# Patient Record
Sex: Male | Born: 1980 | Race: White | Hispanic: No | Marital: Single | Smoking: Current every day smoker
Health system: Southern US, Community
[De-identification: ages and names within clinical notes are randomized; demographics above are authoritative.]

## PROBLEM LIST (undated history)

## (undated) HISTORY — PX: KIDNEY STONE SURGERY: SHX686

---

## 2004-05-08 ENCOUNTER — Other Ambulatory Visit: Payer: Self-pay

## 2004-12-10 ENCOUNTER — Emergency Department: Payer: Self-pay | Admitting: Emergency Medicine

## 2005-05-09 ENCOUNTER — Emergency Department: Payer: Self-pay | Admitting: Emergency Medicine

## 2009-02-02 ENCOUNTER — Emergency Department: Payer: Self-pay | Admitting: Internal Medicine

## 2009-11-25 ENCOUNTER — Emergency Department: Payer: Self-pay | Admitting: Emergency Medicine

## 2010-06-21 ENCOUNTER — Emergency Department: Payer: Self-pay | Admitting: Emergency Medicine

## 2011-07-20 ENCOUNTER — Emergency Department: Payer: Self-pay | Admitting: Emergency Medicine

## 2011-08-19 ENCOUNTER — Emergency Department: Payer: Self-pay

## 2011-09-30 ENCOUNTER — Emergency Department: Payer: Self-pay | Admitting: Emergency Medicine

## 2011-12-30 ENCOUNTER — Emergency Department: Payer: Self-pay | Admitting: Emergency Medicine

## 2011-12-30 LAB — CBC WITH DIFFERENTIAL/PLATELET
Eosinophil #: 0.1 10*3/uL (ref 0.0–0.7)
HCT: 45.5 % (ref 40.0–52.0)
HGB: 15.5 g/dL (ref 13.0–18.0)
Lymphocyte #: 2.2 10*3/uL (ref 1.0–3.6)
Lymphocyte %: 24.1 %
MCH: 31.4 pg (ref 26.0–34.0)
MCHC: 34.1 g/dL (ref 32.0–36.0)
Monocyte #: 0.7 10*3/uL (ref 0.0–0.7)
Monocyte %: 7.5 %
Neutrophil #: 6 10*3/uL (ref 1.4–6.5)
Platelet: 232 10*3/uL (ref 150–440)
RBC: 4.93 10*6/uL (ref 4.40–5.90)
RDW: 12.8 % (ref 11.5–14.5)
WBC: 9 10*3/uL (ref 3.8–10.6)

## 2011-12-30 LAB — COMPREHENSIVE METABOLIC PANEL
Alkaline Phosphatase: 86 U/L (ref 50–136)
BUN: 13 mg/dL (ref 7–18)
Bilirubin,Total: 0.5 mg/dL (ref 0.2–1.0)
Calcium, Total: 10 mg/dL (ref 8.5–10.1)
Chloride: 106 mmol/L (ref 98–107)
Creatinine: 0.96 mg/dL (ref 0.60–1.30)
Potassium: 3.9 mmol/L (ref 3.5–5.1)
SGPT (ALT): 17 U/L
Sodium: 142 mmol/L (ref 136–145)
Total Protein: 8.1 g/dL (ref 6.4–8.2)

## 2011-12-30 LAB — URINALYSIS, COMPLETE
Bilirubin,UR: NEGATIVE
Glucose,UR: NEGATIVE mg/dL (ref 0–75)
Nitrite: NEGATIVE
Protein: 100
RBC,UR: 312 /HPF (ref 0–5)
Squamous Epithelial: NONE SEEN
WBC UR: 9 /HPF (ref 0–5)

## 2012-01-25 ENCOUNTER — Emergency Department: Payer: Self-pay | Admitting: Emergency Medicine

## 2012-01-25 LAB — URINALYSIS, COMPLETE
Bacteria: NONE SEEN
Bilirubin,UR: NEGATIVE
Glucose,UR: NEGATIVE mg/dL (ref 0–75)
Leukocyte Esterase: NEGATIVE
Nitrite: NEGATIVE
Protein: NEGATIVE
RBC,UR: 1 /HPF (ref 0–5)
Squamous Epithelial: NONE SEEN

## 2012-01-25 LAB — CBC
HCT: 42.9 % (ref 40.0–52.0)
HGB: 14.4 g/dL (ref 13.0–18.0)
MCH: 31.1 pg (ref 26.0–34.0)
MCHC: 33.5 g/dL (ref 32.0–36.0)
WBC: 8.8 10*3/uL (ref 3.8–10.6)

## 2012-01-25 LAB — COMPREHENSIVE METABOLIC PANEL
BUN: 11 mg/dL (ref 7–18)
Bilirubin,Total: 0.4 mg/dL (ref 0.2–1.0)
Chloride: 103 mmol/L (ref 98–107)
Co2: 26 mmol/L (ref 21–32)
Creatinine: 0.94 mg/dL (ref 0.60–1.30)
EGFR (African American): >60
EGFR (Non-African Amer.): >60
Osmolality: 281 (ref 275–301)
Sodium: 141 mmol/L (ref 136–145)
Total Protein: 7.8 g/dL (ref 6.4–8.2)

## 2012-01-27 ENCOUNTER — Ambulatory Visit: Payer: Self-pay | Admitting: Medical

## 2012-06-15 ENCOUNTER — Emergency Department: Payer: Self-pay | Admitting: Internal Medicine

## 2012-06-15 LAB — URINALYSIS, COMPLETE
Bilirubin,UR: NEGATIVE
Blood: NEGATIVE
Leukocyte Esterase: NEGATIVE
Nitrite: NEGATIVE
Ph: 9 (ref 4.5–8.0)
Protein: NEGATIVE
RBC,UR: NONE SEEN /HPF (ref 0–5)
Squamous Epithelial: <1

## 2012-06-15 LAB — BASIC METABOLIC PANEL
Calcium, Total: 9.7 mg/dL (ref 8.5–10.1)
Chloride: 105 mmol/L (ref 98–107)
Co2: 26 mmol/L (ref 21–32)
Creatinine: 0.87 mg/dL (ref 0.60–1.30)
Potassium: 3.9 mmol/L (ref 3.5–5.1)
Sodium: 140 mmol/L (ref 136–145)

## 2012-06-15 LAB — CBC
HCT: 47 % (ref 40.0–52.0)
HGB: 15.1 g/dL (ref 13.0–18.0)
MCHC: 32.2 g/dL (ref 32.0–36.0)
Platelet: 221 10*3/uL (ref 150–440)
RDW: 13.5 % (ref 11.5–14.5)
WBC: 7.2 10*3/uL (ref 3.8–10.6)

## 2012-06-20 ENCOUNTER — Emergency Department: Payer: Self-pay | Admitting: Unknown Physician Specialty

## 2012-06-20 LAB — BASIC METABOLIC PANEL
BUN: 13 mg/dL (ref 7–18)
Chloride: 109 mmol/L — ABNORMAL HIGH (ref 98–107)
Creatinine: 0.88 mg/dL (ref 0.60–1.30)
EGFR (Non-African Amer.): >60
Glucose: 94 mg/dL (ref 65–99)
Osmolality: 283 (ref 275–301)
Potassium: 4.1 mmol/L (ref 3.5–5.1)
Sodium: 142 mmol/L (ref 136–145)

## 2012-06-20 LAB — URINALYSIS, COMPLETE
Bacteria: NONE SEEN
Ketone: NEGATIVE
Ph: 9 (ref 4.5–8.0)
Protein: NEGATIVE
Specific Gravity: 1.005 (ref 1.003–1.030)
Squamous Epithelial: NONE SEEN

## 2012-06-20 LAB — CBC
HCT: 46.5 % (ref 40.0–52.0)
HGB: 15.2 g/dL (ref 13.0–18.0)
MCH: 30.6 pg (ref 26.0–34.0)
MCHC: 32.7 g/dL (ref 32.0–36.0)
MCV: 94 fL (ref 80–100)
RDW: 13.1 % (ref 11.5–14.5)

## 2012-06-20 LAB — DRUG SCREEN, URINE
Benzodiazepine, Ur Scrn: NEGATIVE (ref ?–200)
Cannabinoid 50 Ng, Ur ~~LOC~~: POSITIVE (ref ?–50)
Cocaine Metabolite,Ur ~~LOC~~: NEGATIVE (ref ?–300)
Methadone, Ur Screen: NEGATIVE (ref ?–300)
Phencyclidine (PCP) Ur S: NEGATIVE (ref ?–25)

## 2012-09-03 ENCOUNTER — Emergency Department: Payer: Self-pay | Admitting: Emergency Medicine

## 2013-01-10 IMAGING — CR DG ABDOMEN 1V
1 series · 2 of 2 positions shown · non-contrast
Comparison: none

REASON FOR EXAM: sx of renal colic
COMMENTS:

[Series 1: t abdomen supine · 0.14mm/px · 2 of 2 slices shown]
[im 1/2]
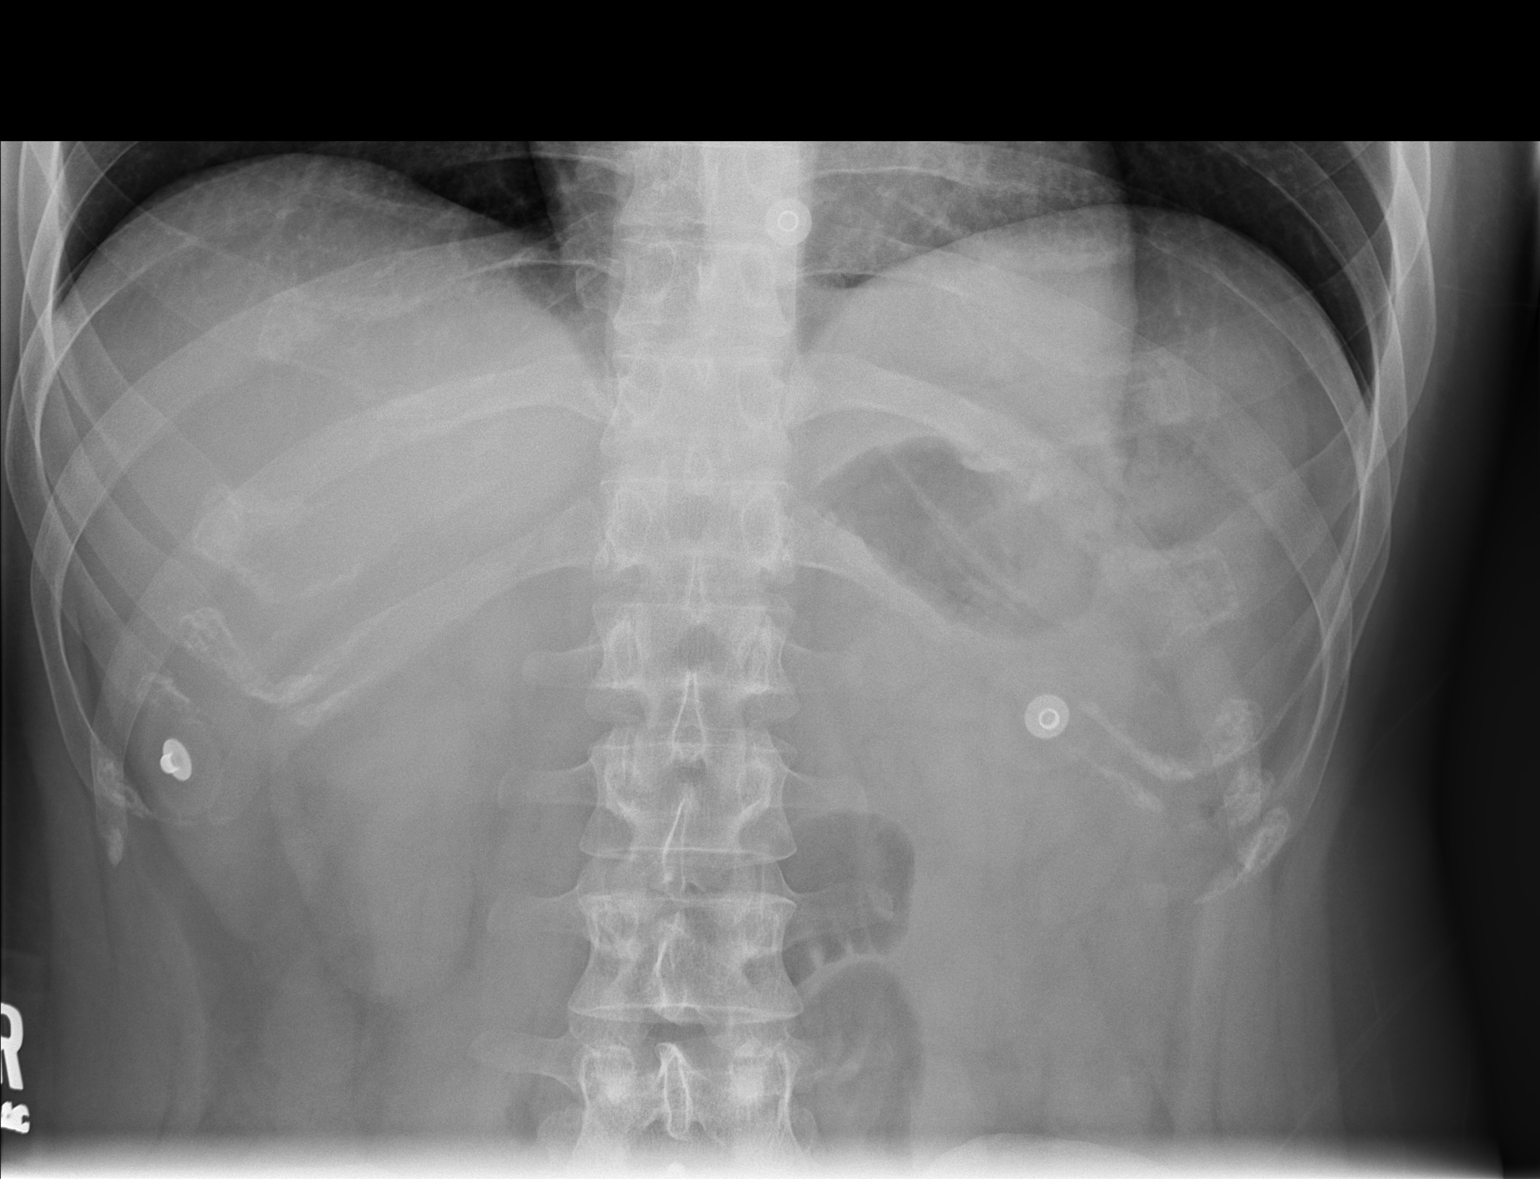
[im 2/2]
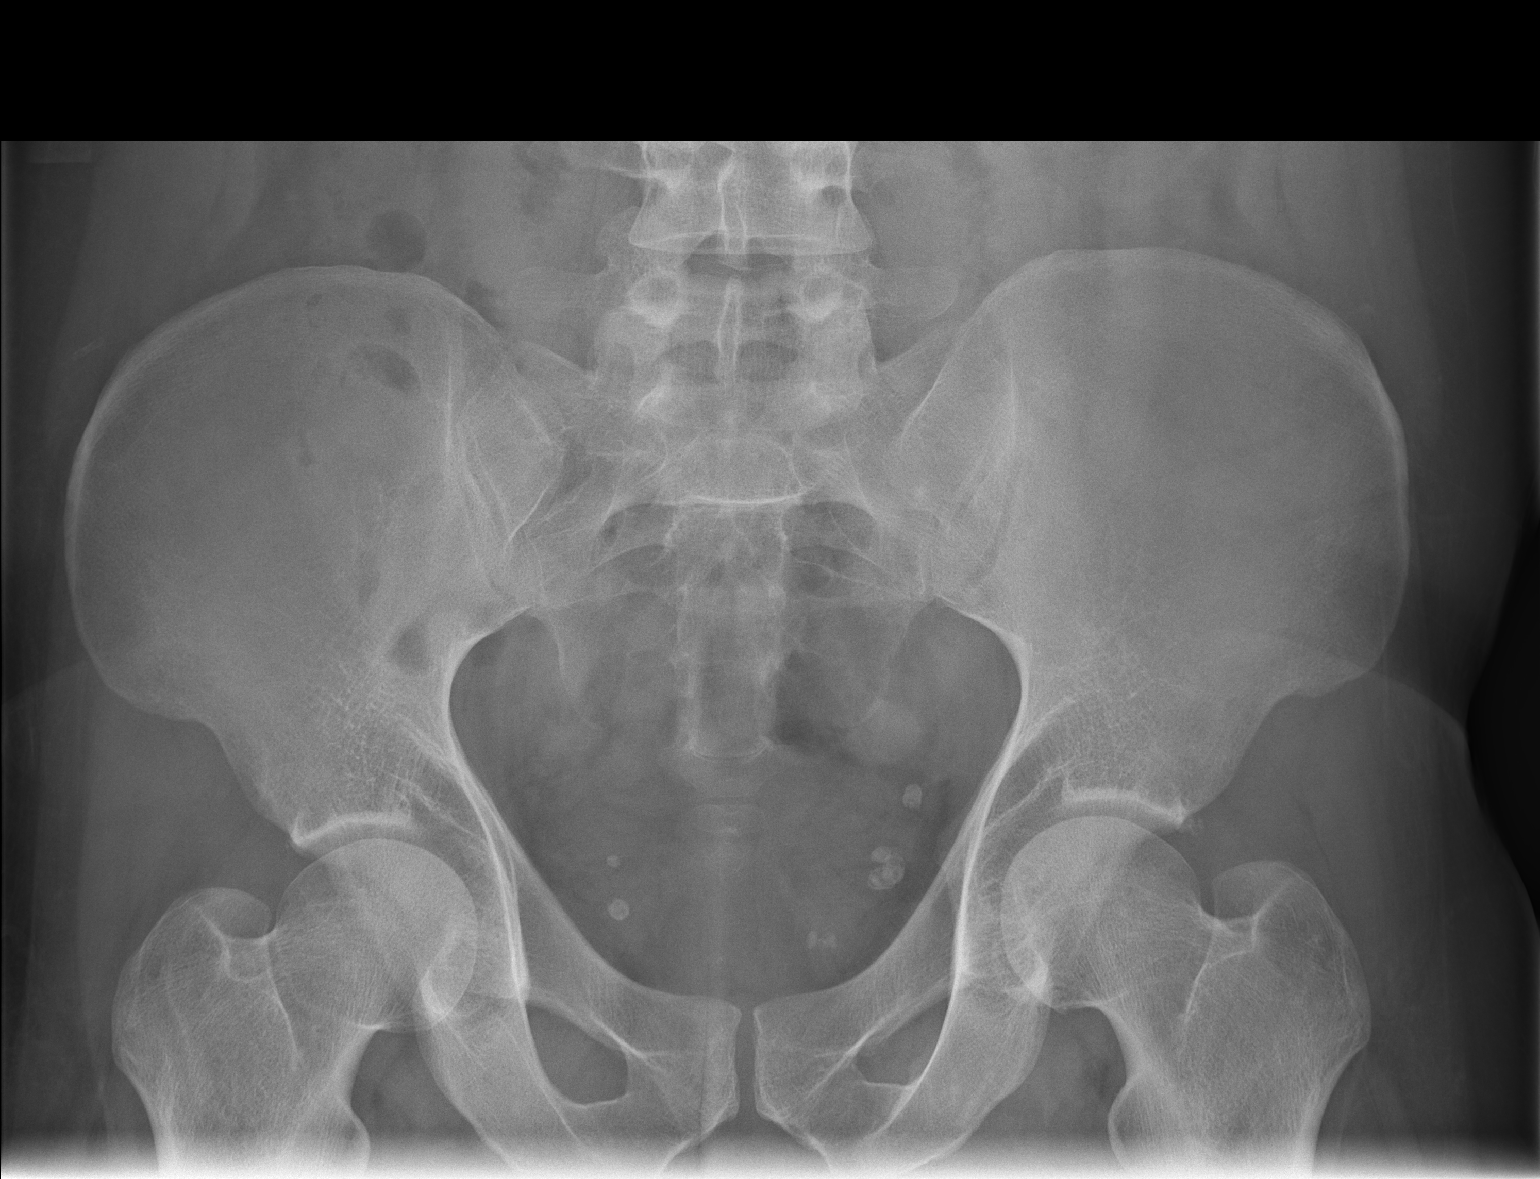

[2 of 2 positions shown; findings below may reference images not displayed]

PROCEDURE:     DXR - DXR KIDNEY URETER BLADDER  - December 30, 2011  [DATE]

RESULT:     Soft tissue structures are unremarkable. Multiple calcifications
in the pelvis consistent phleboliths. This ureteral stone cannot be
completely excluded. Tiny ureteral stone projecting over the left sacrum may
be present. No nephrolithiasis noted. Gas pattern nonspecific.
IMPRESSION: Cannot exclude tiny calcified stone projecting over the
left sacrum. Calcifications in the pelvis most likely phleboliths.

## 2013-02-01 ENCOUNTER — Emergency Department: Payer: Self-pay

## 2013-02-01 LAB — CBC
HCT: 42.1 % (ref 40.0–52.0)
MCH: 30.6 pg (ref 26.0–34.0)
MCHC: 33.7 g/dL (ref 32.0–36.0)
MCV: 91 fL (ref 80–100)
Platelet: 240 10*3/uL (ref 150–440)
RBC: 4.63 10*6/uL (ref 4.40–5.90)
WBC: 7.9 10*3/uL (ref 3.8–10.6)

## 2013-02-01 LAB — BASIC METABOLIC PANEL
BUN: 7 mg/dL (ref 7–18)
Calcium, Total: 9.6 mg/dL (ref 8.5–10.1)
Chloride: 108 mmol/L — ABNORMAL HIGH (ref 98–107)
Co2: 27 mmol/L (ref 21–32)
EGFR (African American): >60
EGFR (Non-African Amer.): >60
Glucose: 92 mg/dL (ref 65–99)
Osmolality: 279 (ref 275–301)
Potassium: 3.4 mmol/L — ABNORMAL LOW (ref 3.5–5.1)
Sodium: 141 mmol/L (ref 136–145)

## 2013-02-01 LAB — URINALYSIS, COMPLETE
Ketone: NEGATIVE
Leukocyte Esterase: NEGATIVE
Nitrite: NEGATIVE
Ph: 9 (ref 4.5–8.0)
Specific Gravity: 1.012 (ref 1.003–1.030)
Squamous Epithelial: <1
WBC UR: <1 /HPF (ref 0–5)

## 2013-02-07 IMAGING — CR DG ABDOMEN 1V
1 series · 2 of 2 positions shown · non-contrast
Comparison: none

REASON FOR EXAM: Kidney Stone
COMMENTS:

[Series 1: t abdomen supine · 0.14mm/px · 2 of 2 slices shown]
[im 1/2]
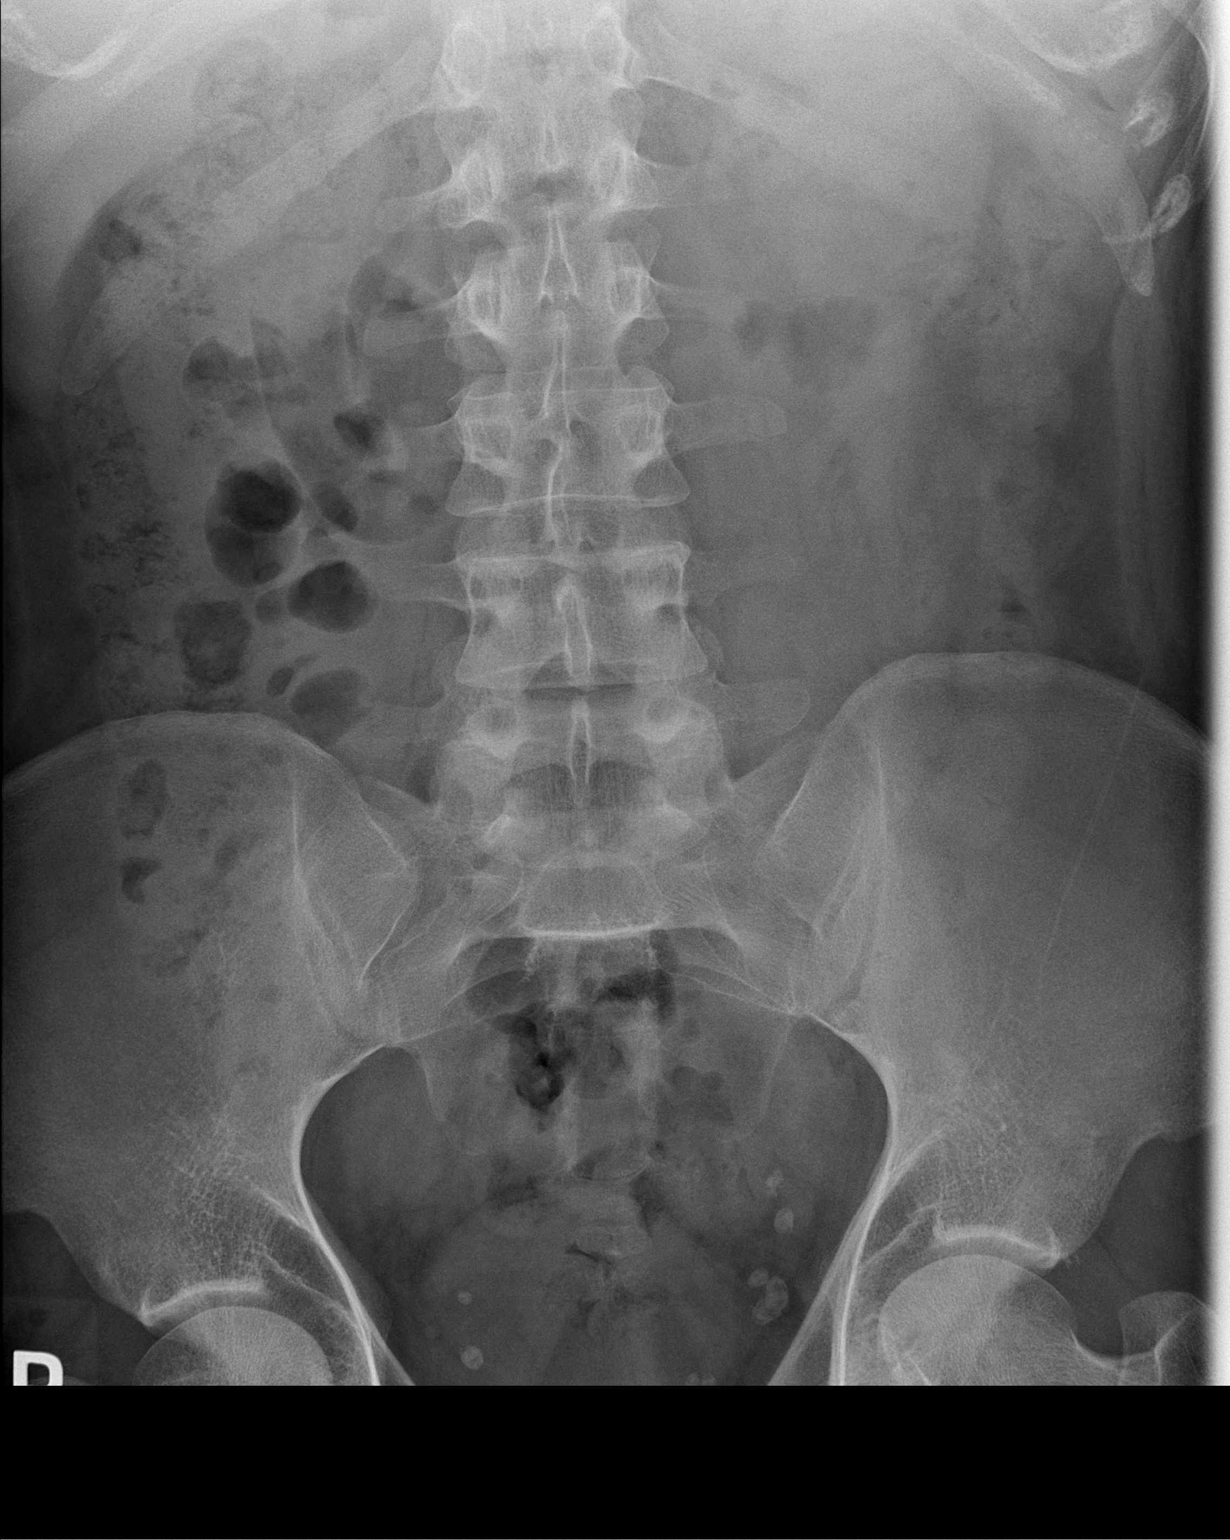
[im 2/2]
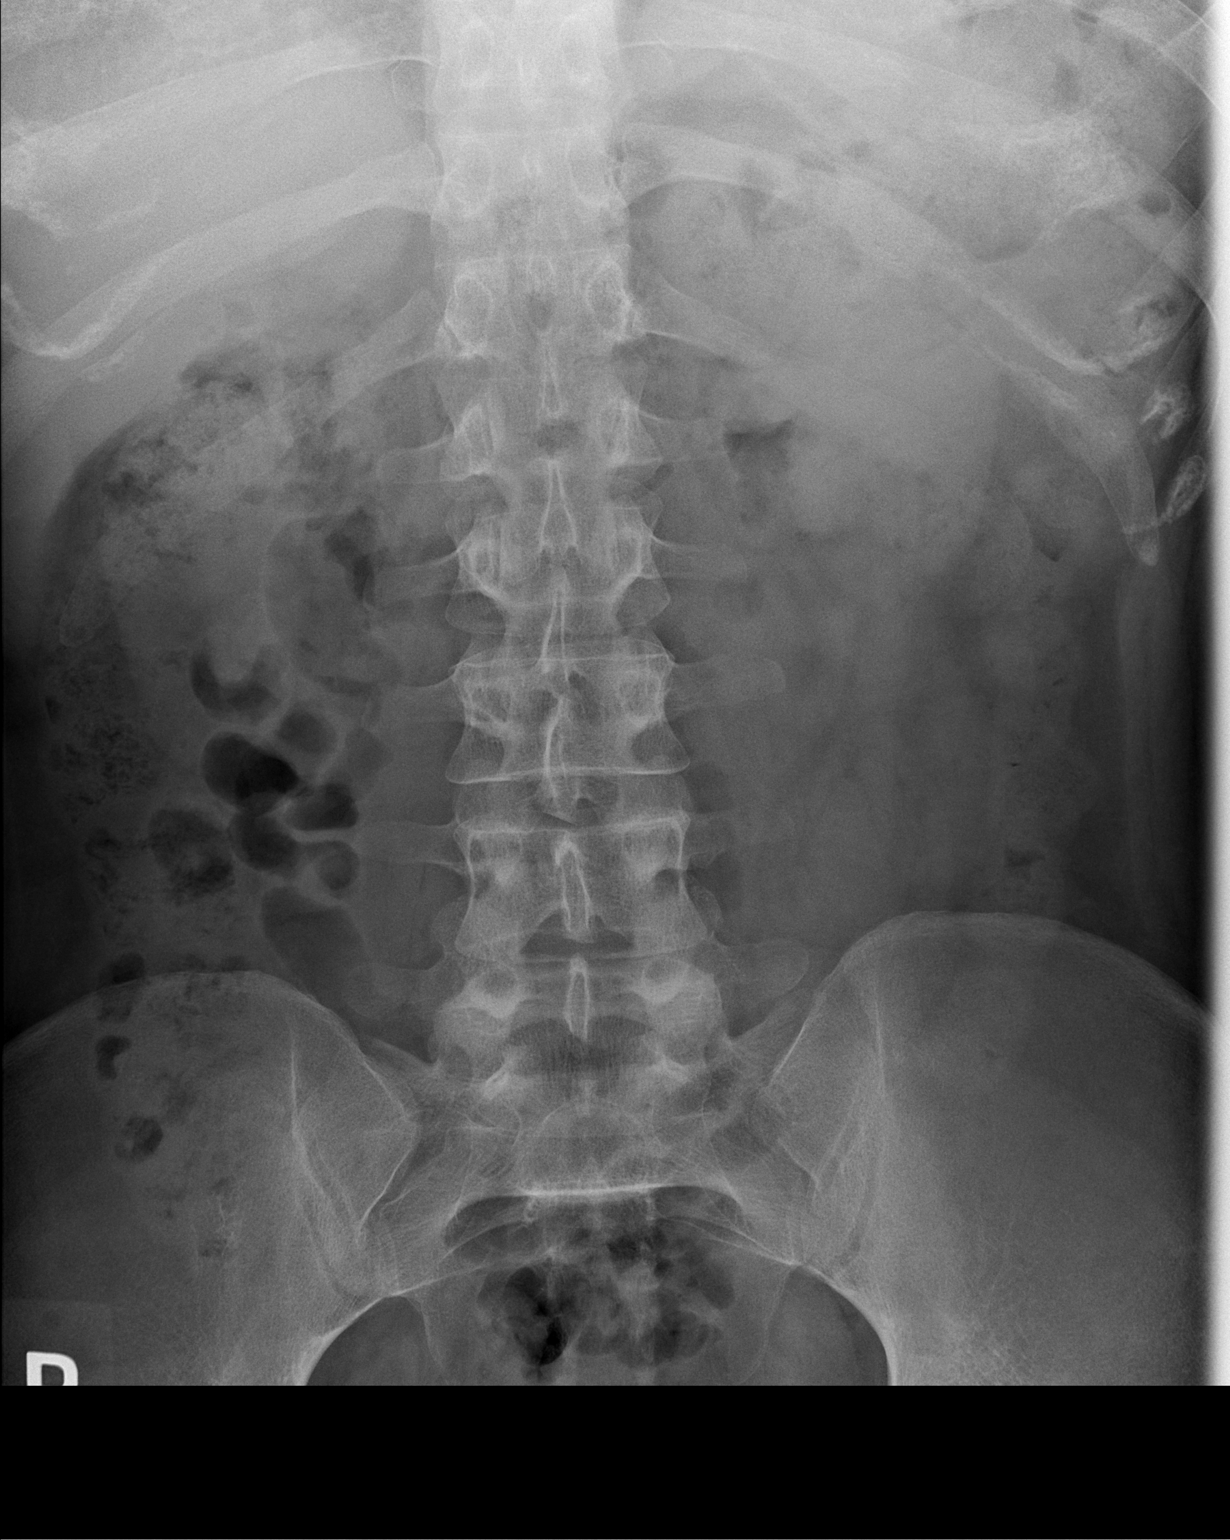

[2 of 2 positions shown; findings below may reference images not displayed]

PROCEDURE:     DXR - DXR KIDNEY URETER BLADDER  - January 27, 2012  [DATE]

RESULT:     An AP view of the abdomen was obtained. There is a moderately
large amount of fecal material in the colon. No definite intrarenal
calcifications are seen. There is an 8 mm calcification in the left pelvis
compatible with a distal left ureteral stone. Multiple additional
phleboliths are also seen in the pelvic area bilaterally. No significant
osseous abnormalities are identified.
IMPRESSION: Distal left ureterolithiasis.

## 2013-02-13 ENCOUNTER — Emergency Department: Payer: Self-pay | Admitting: Emergency Medicine

## 2013-02-13 LAB — COMPREHENSIVE METABOLIC PANEL
BUN: 13 mg/dL (ref 7–18)
Bilirubin,Total: 0.5 mg/dL (ref 0.2–1.0)
Calcium, Total: 9.4 mg/dL (ref 8.5–10.1)
Chloride: 105 mmol/L (ref 98–107)
Co2: 29 mmol/L (ref 21–32)
EGFR (African American): >60
Glucose: 95 mg/dL (ref 65–99)
Osmolality: 277 (ref 275–301)
Potassium: 3.8 mmol/L (ref 3.5–5.1)
SGOT(AST): 21 U/L (ref 15–37)
SGPT (ALT): 27 U/L (ref 12–78)
Total Protein: 8.4 g/dL — ABNORMAL HIGH (ref 6.4–8.2)

## 2013-02-13 LAB — CBC
HCT: 44.4 % (ref 40.0–52.0)
HGB: 15.4 g/dL (ref 13.0–18.0)
MCH: 31.3 pg (ref 26.0–34.0)
RBC: 4.9 10*6/uL (ref 4.40–5.90)
RDW: 13.5 % (ref 11.5–14.5)

## 2013-02-13 LAB — URINALYSIS, COMPLETE
Bacteria: NONE SEEN
Bilirubin,UR: NEGATIVE
Glucose,UR: NEGATIVE mg/dL (ref 0–75)
Nitrite: NEGATIVE
Protein: 30
Specific Gravity: 1.023 (ref 1.003–1.030)
WBC UR: NONE SEEN /HPF (ref 0–5)

## 2013-04-21 ENCOUNTER — Observation Stay: Payer: Self-pay | Admitting: Internal Medicine

## 2013-04-21 LAB — CBC
HCT: 42.9 % (ref 40.0–52.0)
MCH: 30.4 pg (ref 26.0–34.0)
MCHC: 33.5 g/dL (ref 32.0–36.0)
MCV: 91 fL (ref 80–100)
Platelet: 234 10*3/uL (ref 150–440)
RBC: 4.72 10*6/uL (ref 4.40–5.90)
WBC: 8.1 10*3/uL (ref 3.8–10.6)

## 2013-04-21 LAB — URINALYSIS, COMPLETE
Leukocyte Esterase: NEGATIVE
Ph: 6 (ref 4.5–8.0)
Specific Gravity: 1.011 (ref 1.003–1.030)
WBC UR: 1 /HPF (ref 0–5)

## 2013-04-21 LAB — DRUG SCREEN, URINE
Amphetamines, Ur Screen: NEGATIVE (ref ?–1000)
Barbiturates, Ur Screen: NEGATIVE (ref ?–200)
Cocaine Metabolite,Ur ~~LOC~~: NEGATIVE (ref ?–300)
MDMA (Ecstasy)Ur Screen: NEGATIVE (ref ?–500)
Tricyclic, Ur Screen: NEGATIVE (ref ?–1000)

## 2013-04-21 LAB — COMPREHENSIVE METABOLIC PANEL
Albumin: 3.9 g/dL (ref 3.4–5.0)
BUN: 10 mg/dL (ref 7–18)
Calcium, Total: 9.4 mg/dL (ref 8.5–10.1)
Creatinine: 0.84 mg/dL (ref 0.60–1.30)
Glucose: 127 mg/dL — ABNORMAL HIGH (ref 65–99)
SGPT (ALT): 234 U/L — ABNORMAL HIGH (ref 12–78)

## 2013-04-21 LAB — ETHANOL: Ethanol: <3 mg/dL

## 2013-04-21 LAB — SALICYLATE LEVEL: Salicylates, Serum: 3.1 mg/dL — ABNORMAL HIGH

## 2013-04-21 LAB — TSH: Thyroid Stimulating Horm: 2.58 u[IU]/mL

## 2013-04-21 LAB — PROTIME-INR: INR: 1

## 2013-04-21 LAB — APTT: Activated PTT: 31 secs (ref 23.6–35.9)

## 2013-04-22 LAB — COMPREHENSIVE METABOLIC PANEL
Albumin: 3.5 g/dL (ref 3.4–5.0)
Alkaline Phosphatase: 138 U/L — ABNORMAL HIGH (ref 50–136)
Calcium, Total: 9 mg/dL (ref 8.5–10.1)
Co2: 28 mmol/L (ref 21–32)
Creatinine: 0.69 mg/dL (ref 0.60–1.30)
EGFR (African American): >60
EGFR (Non-African Amer.): >60
Glucose: 87 mg/dL (ref 65–99)
SGOT(AST): 90 U/L — ABNORMAL HIGH (ref 15–37)
SGPT (ALT): 187 U/L — ABNORMAL HIGH (ref 12–78)
Total Protein: 7.2 g/dL (ref 6.4–8.2)

## 2013-04-26 ENCOUNTER — Emergency Department: Payer: Self-pay | Admitting: Internal Medicine

## 2013-06-19 ENCOUNTER — Emergency Department: Payer: Self-pay | Admitting: Emergency Medicine

## 2013-06-19 LAB — URINALYSIS, COMPLETE
Bilirubin,UR: NEGATIVE
Ketone: NEGATIVE
Leukocyte Esterase: NEGATIVE
Nitrite: NEGATIVE
Ph: 8 (ref 4.5–8.0)
Protein: NEGATIVE
Squamous Epithelial: <1

## 2013-06-19 LAB — COMPREHENSIVE METABOLIC PANEL
Albumin: 4.2 g/dL (ref 3.4–5.0)
Alkaline Phosphatase: 142 U/L — ABNORMAL HIGH (ref 50–136)
BUN: 10 mg/dL (ref 7–18)
Bilirubin,Total: 0.7 mg/dL (ref 0.2–1.0)
Calcium, Total: 9.5 mg/dL (ref 8.5–10.1)
Co2: 27 mmol/L (ref 21–32)
Creatinine: 0.84 mg/dL (ref 0.60–1.30)
EGFR (African American): >60
EGFR (Non-African Amer.): >60
Glucose: 140 mg/dL — ABNORMAL HIGH (ref 65–99)
Osmolality: 275 (ref 275–301)
Potassium: 4 mmol/L (ref 3.5–5.1)
Total Protein: 7.9 g/dL (ref 6.4–8.2)

## 2013-06-19 LAB — DRUG SCREEN, URINE
Amphetamines, Ur Screen: NEGATIVE (ref ?–1000)
Barbiturates, Ur Screen: NEGATIVE (ref ?–200)
Benzodiazepine, Ur Scrn: NEGATIVE (ref ?–200)
Cannabinoid 50 Ng, Ur ~~LOC~~: POSITIVE (ref ?–50)
Opiate, Ur Screen: NEGATIVE (ref ?–300)
Phencyclidine (PCP) Ur S: NEGATIVE (ref ?–25)
Tricyclic, Ur Screen: NEGATIVE (ref ?–1000)

## 2013-06-19 LAB — CBC
HCT: 44.2 % (ref 40.0–52.0)
HGB: 15.1 g/dL (ref 13.0–18.0)
MCH: 30.4 pg (ref 26.0–34.0)
MCV: 89 fL (ref 80–100)
Platelet: 191 10*3/uL (ref 150–440)
RBC: 4.96 10*6/uL (ref 4.40–5.90)

## 2013-06-19 LAB — ETHANOL: Ethanol %: <0.003 % (ref 0.000–0.080)

## 2013-07-23 ENCOUNTER — Emergency Department: Payer: Self-pay | Admitting: Emergency Medicine

## 2013-09-16 IMAGING — CR DG HAND 2V*L*
1 series · 2 of 2 positions shown · non-contrast
Comparison: none

REASON FOR EXAM: post reduction
COMMENTS:

PROCEDURE:     DXR - DXR HAND LT TWO VIEWS  - September 04, 2012  [DATE]
RESULT:     Comparison:  None

[Series 1: pa · 0.17mm/px · 2 of 2 slices shown]
[im 1/2]
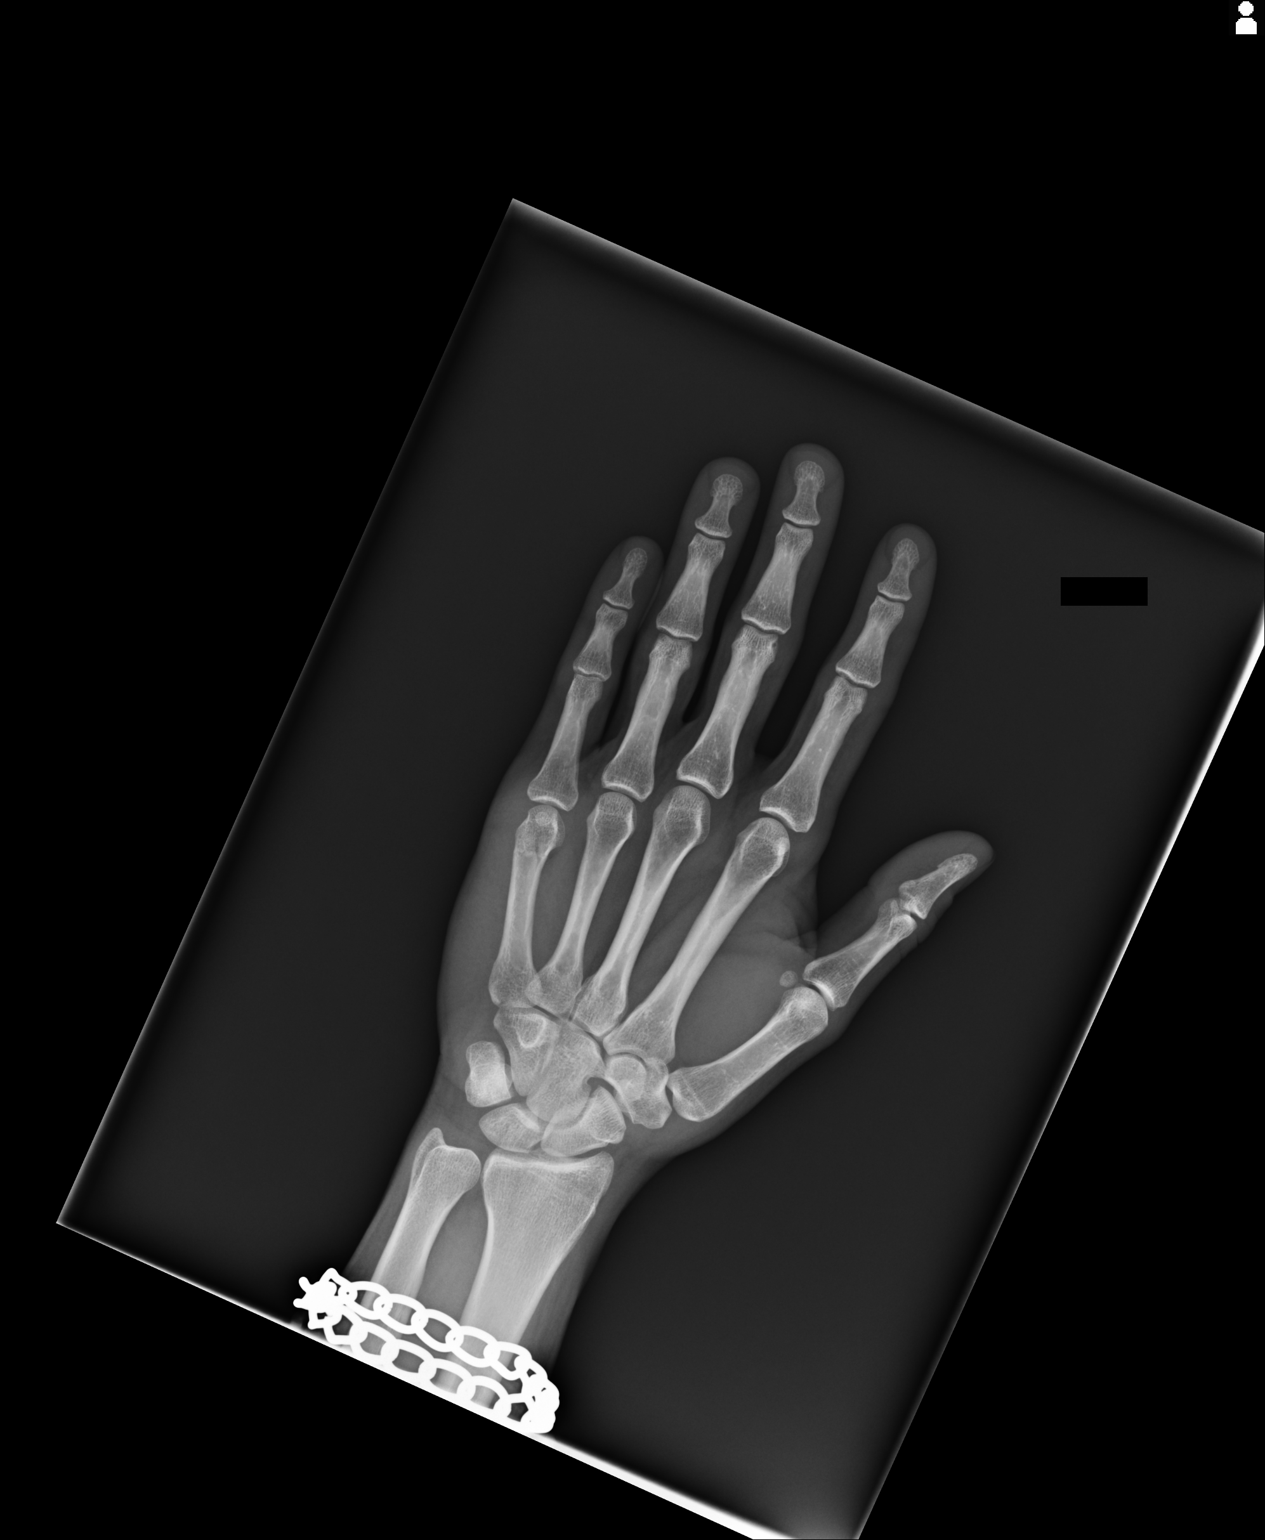
[im 2/2]
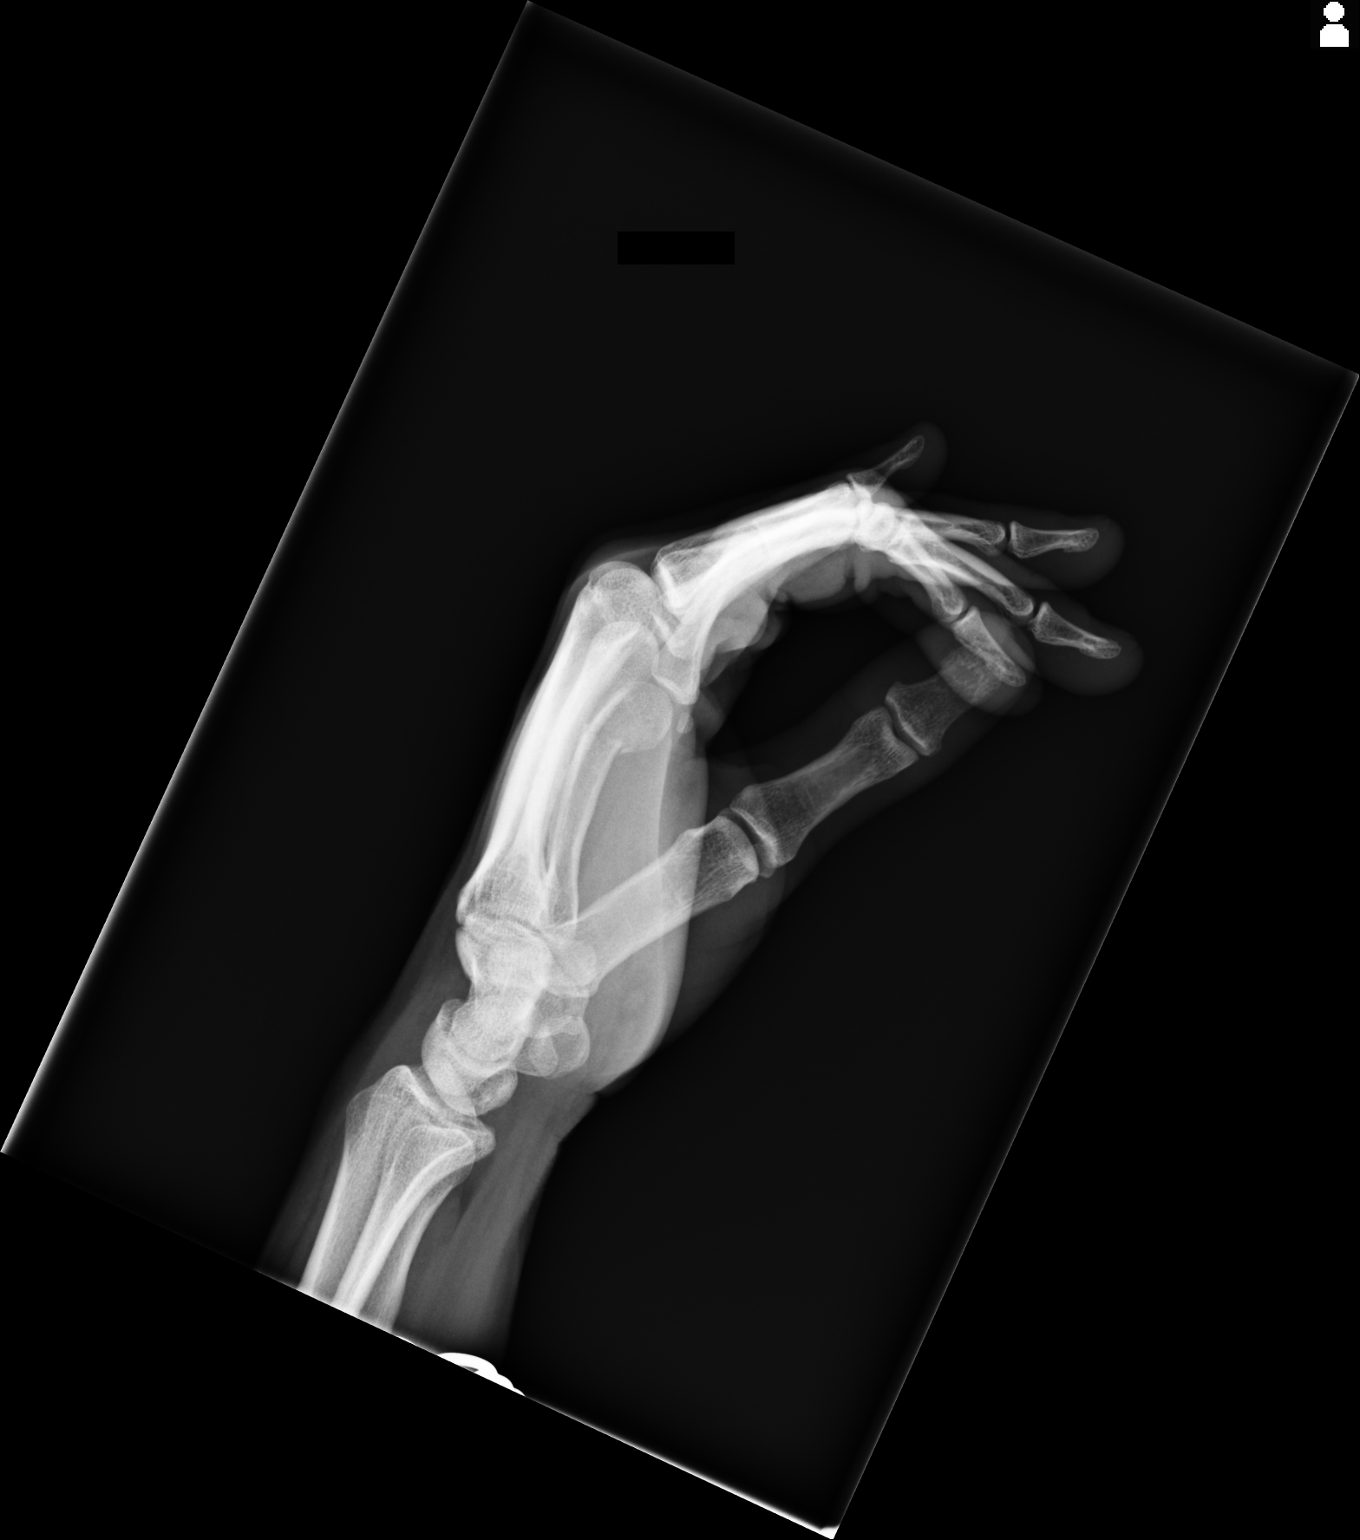

[2 of 2 positions shown; findings below may reference images not displayed]

FINDINGS: AP and lateral views of the left hand demonstrates a left fifth metacarpal
neck fracture with improved alignment.
IMPRESSION: Please see above.

[REDACTED]

## 2013-11-29 ENCOUNTER — Emergency Department: Payer: Self-pay | Admitting: Emergency Medicine

## 2013-11-29 LAB — URINALYSIS, COMPLETE
Bacteria: NONE SEEN
Bilirubin,UR: NEGATIVE
Ketone: NEGATIVE
Leukocyte Esterase: NEGATIVE
RBC,UR: 2065 /HPF (ref 0–5)
Squamous Epithelial: 2
WBC UR: 73 /HPF (ref 0–5)

## 2013-11-29 LAB — COMPREHENSIVE METABOLIC PANEL
Anion Gap: 4 — ABNORMAL LOW (ref 7–16)
BUN: 8 mg/dL (ref 7–18)
Chloride: 101 mmol/L (ref 98–107)
EGFR (African American): >60
EGFR (Non-African Amer.): >60
Potassium: 3.6 mmol/L (ref 3.5–5.1)
SGOT(AST): 57 U/L — ABNORMAL HIGH (ref 15–37)
Sodium: 134 mmol/L — ABNORMAL LOW (ref 136–145)

## 2013-11-29 LAB — CBC WITH DIFFERENTIAL/PLATELET
Basophil %: 0.6 %
Eosinophil #: 0.1 10*3/uL (ref 0.0–0.7)
Eosinophil %: 1.7 %
HCT: 44 % (ref 40.0–52.0)
Lymphocyte #: 1.8 10*3/uL (ref 1.0–3.6)
Lymphocyte %: 26 %
MCH: 30.5 pg (ref 26.0–34.0)
MCHC: 33.5 g/dL (ref 32.0–36.0)
MCV: 91 fL (ref 80–100)
Monocyte #: 0.7 x10 3/mm (ref 0.2–1.0)
Neutrophil #: 4.3 10*3/uL (ref 1.4–6.5)
Neutrophil %: 62.1 %
Platelet: 226 10*3/uL (ref 150–440)
RDW: 13.7 % (ref 11.5–14.5)
WBC: 6.9 10*3/uL (ref 3.8–10.6)

## 2014-02-28 ENCOUNTER — Emergency Department: Payer: Self-pay | Admitting: Internal Medicine

## 2014-08-28 ENCOUNTER — Emergency Department: Payer: Self-pay | Admitting: Emergency Medicine

## 2014-08-28 LAB — CBC WITH DIFFERENTIAL/PLATELET
BASOS ABS: 0.1 10*3/uL (ref 0.0–0.1)
Basophil %: 0.4 %
Eosinophil #: 0.2 10*3/uL (ref 0.0–0.7)
Eosinophil %: 1.4 %
HCT: 43.4 % (ref 40.0–52.0)
HGB: 13.7 g/dL (ref 13.0–18.0)
LYMPHS ABS: 1.9 10*3/uL (ref 1.0–3.6)
LYMPHS PCT: 10.5 %
MCH: 29 pg (ref 26.0–34.0)
MCHC: 31.7 g/dL — ABNORMAL LOW (ref 32.0–36.0)
MCV: 92 fL (ref 80–100)
Monocyte #: 1.2 x10 3/mm — ABNORMAL HIGH (ref 0.2–1.0)
Monocyte %: 6.9 %
NEUTROS ABS: 14.3 10*3/uL — AB (ref 1.4–6.5)
Neutrophil %: 80.8 %
PLATELETS: 250 10*3/uL (ref 150–440)
RBC: 4.73 10*6/uL (ref 4.40–5.90)
RDW: 13.9 % (ref 11.5–14.5)
WBC: 17.7 10*3/uL — ABNORMAL HIGH (ref 3.8–10.6)

## 2014-08-28 LAB — URINALYSIS, COMPLETE
BACTERIA: NONE SEEN
BILIRUBIN, UR: NEGATIVE
Glucose,UR: NEGATIVE mg/dL (ref 0–75)
KETONE: NEGATIVE
LEUKOCYTE ESTERASE: NEGATIVE
NITRITE: NEGATIVE
Ph: 8 (ref 4.5–8.0)
Protein: 30
RBC,UR: 4264 /HPF (ref 0–5)
SPECIFIC GRAVITY: 1.016 (ref 1.003–1.030)
SQUAMOUS EPITHELIAL: NONE SEEN
WBC UR: NONE SEEN /HPF (ref 0–5)

## 2014-08-28 LAB — BASIC METABOLIC PANEL
Anion Gap: 8 (ref 7–16)
BUN: 11 mg/dL (ref 7–18)
CHLORIDE: 105 mmol/L (ref 98–107)
CO2: 26 mmol/L (ref 21–32)
CREATININE: 0.92 mg/dL (ref 0.60–1.30)
Calcium, Total: 8.9 mg/dL (ref 8.5–10.1)
EGFR (African American): >60
EGFR (Non-African Amer.): >60
GLUCOSE: 100 mg/dL — AB (ref 65–99)
OSMOLALITY: 277 (ref 275–301)
POTASSIUM: 3.6 mmol/L (ref 3.5–5.1)
Sodium: 139 mmol/L (ref 136–145)

## 2014-08-29 LAB — DRUG SCREEN, URINE
AMPHETAMINES, UR SCREEN: NEGATIVE (ref ?–1000)
BARBITURATES, UR SCREEN: NEGATIVE (ref ?–200)
Benzodiazepine, Ur Scrn: NEGATIVE (ref ?–200)
CANNABINOID 50 NG, UR ~~LOC~~: POSITIVE (ref ?–50)
COCAINE METABOLITE, UR ~~LOC~~: NEGATIVE (ref ?–300)
MDMA (Ecstasy)Ur Screen: NEGATIVE (ref ?–500)
METHADONE, UR SCREEN: NEGATIVE (ref ?–300)
Opiate, Ur Screen: POSITIVE (ref ?–300)
Phencyclidine (PCP) Ur S: NEGATIVE (ref ?–25)
Tricyclic, Ur Screen: NEGATIVE (ref ?–1000)

## 2015-03-22 NOTE — Discharge Summary (Signed)
PATIENT NAME:  Ricardo Tran, Ricardo Tran MR#:  [id number] DATE OF BIRTH:  June 10, 1981  DATE OF ADMISSION:  04/21/2013 DATE OF DISCHARGE:  04/22/2013  DISCHARGE DIAGNOSES:  1.  Heroin abuse, willing for rehab.  2.  Elevated liver function tests coming down to safe range.  3.  Smoker.   CONDITION ON DISCHARGE:  Stable.  CODE STATUS:  FULL CODE.   MEDICATIONS ON DISCHARGE: 1.  Suboxone sublingual film, one application 2 times a day.  2.  Loperamide 2 mg oral capsule 4 times a day as needed for diarrhea.  3.  Methocarbamol 750 mg oral tablet 2 times a day as needed for pain.   DIET ON DISCHARGE:  Regular.  CONSISTENCY:  Regular.   ACTIVITY:  As tolerated.   TIMEFRAME TO FOLLOWUP:  Within 1 to 2 weeks. He is advised to follow with primary care physician in 1 to 2 weeks to get the pending results from hospital for hepatitis.   HOSPITAL COURSE AND STAY:  The patient is a 34 year old male who is addicted to IV heroin almost every day, and now willing to go for rehab. He went to contact the facility and they told him to get medical clearance before he can be accepted as inpatient rehab for opioid addiction. He came to the ER for medical clearance and he is found having his LFTs were high. He said that he was taking on and off Percocet, sometimes 10 to 15 per day to get himself out of pain due to heroin withdrawal. His acetaminophen level was less than 2 but his AST and ALT were in the range of 200, so he was admitted to medical service for observation and medical clearance before he can be accepted at rehab facility. His hepatitis and HIV levels were sent in. He was started [facility]steine [facility], was remaining in mild withdrawal syndrome due to pain and nausea and some diarrhea secondary to opioid withdrawal but managed by psychiatrist team with Suboxone, loperamide and Zofran and Phenergan as needed basis. The next day, his liver functions came down further and  his AST and ALT were in the range of 100, so poison control suggested to stop an acetylcysteine as that is in safe range, and the patient was asymptomatic, medically stable to go for opioid rehab, so he is being discharged for that.   Smoker. He was active smoker and at the time he was counseled for smoking cessation for 5 minutes and he agreed to have nicotine inhaler while he is in the hospital.   IMPORTANT LABORATORY RESULTS IN THE HOSPITAL:  Total WBC count 8000, hemoglobin 14.4, platelet count 234. BUN 10, creatinine 0.84, alkaline phosphate 158, SGPT 234 and SGOT 126, with bilirubin of 0.4 on presentation. Ethanol level was less than 3. TSH was 2.58. Urinalysis was negative. Urine toxicology was positive for opiate and cannabinoid. Salicylate level was 3.1. Acetaminophen level was less than 2. INR was 1, and so the next day on followup of his liver function, alkaline phosphate came to 138, SGPT to 187, SGOT to 90, and he is being discharged with this level, medically stable.   TOTAL TIME SPENT IN THIS DISCHARGE:  40 minutes.  ____________________________ Hope PigeonVaibhavkumar G. Elisabeth PigeonVachhani, MD vgv:jm D: 04/22/2013 11:10:00 ET T: 04/22/2013 11:44:04 ET JOB#: [id number]  cc: Hope PigeonVaibhavkumar G. Elisabeth PigeonVachhani, MD, <Dictator> Altamese DillingVAIBHAVKUMAR Marbeth Smedley MD ELECTRONICALLY SIGNED 04/27/2013 14:24

## 2015-03-22 NOTE — Consult Note (Signed)
PATIENT NAME:  Ricardo Tran, Ricardo Tran MR#:  [id number] DATE OF BIRTH:  28-Sep-1981  DATE OF CONSULTATION:  04/21/2013  CONSULTING PHYSICIAN:  Audery Amel, MD  IDENTIFYING INFORMATION AND REASON FOR CONSULT: A 34 year old man presented to the Emergency Room requesting stabilization for detox from heroin. Consultation for assistance with opiate detox.   HISTORY OF PRESENT ILLNESS: Information obtained from the patient and the chart. The patient came to our Emergency Room after first going to [facility] today. [facility] told him that he needed to be medically stabilized before he can go there for treatment of his opiate dependence. The patient reports that he has been using intravenous heroin on a daily basis for probably about 12 years. His current level of use is about a gram a day in 2 divided doses. He has been he going at this rate consistently for years. He says he smokes pot occasionally, but does not use cocaine or other drugs. He does not drink alcohol. On occasion he will substitute oral narcotics that he gets off the street for his heroine when he had difficulty getting heroin on a particular day. This past Monday, he took about 17, 10 mg of Percocet tablets. His last heroin dose was just about 24 hours ago. The patient he does not report being depressed. Denies suicidal ideation. Denies psychotic symptoms. He says that he is fed up with how he is living, particularly since he has two children at home and that he would like to get serious about finally getting off of drugs.   PAST PSYCHIATRIC HISTORY: Has not been treated for any other psychiatric condition other than his substance abuse and the substance abuse itself, he is only done self-detox on a couple of occasions. He has never been in a hospital for detox and he has never been in any kind of rehab or other substance abuse treatment program. No history of suicide attempts. No history of violence. No history of psychotic symptoms.   PAST MEDICAL HISTORY: The  patient does not know of any ongoing medical problems. On presentation to the Emergency Room here, he is liver enzymes were elevated. Given the history of having taken  possibly a very large dose of acetaminophen just a few days ago, there is concern that one possible cause of this could be acetaminophen toxicity. The patient says he has never had hepatitis testing or ever had HIV test.   SOCIAL HISTORY: The patient is married, has two children, one age 34 years, the other one a toddler. The patient works at the [employer] in [place]. His wife works at [employer]. He has family in the area and says that his parents would probably be supportive. He does not have insurance, but thinks that his family would probably help to support financially his substance abuse treatment.   CURRENT MEDICATIONS: No prescribed medicines. He is taking,  by his estimate, a gram of heroin a day in divided doses. Occasional marijuana.   ALLERGIES: No known drug allergies.   REVIEW OF SYSTEMS: At the time of my interview, the patient complains of a full-body aches and pains, especially back pain and neck pain. He has congestion, chills, general aches, a feeling like he is having the flu. He has had some nausea but has not vomited. He has been having some diarrhea. He does not report any suicidal or homicidal ideation. Does not report any psychotic symptoms.   MENTAL STATUS EXAM: The patient was interviewed in his hospital room. This gentleman looks  significantly older than his stated age. He was cooperative with the interview. Eye contact was intermittent. Psychomotor activity was sluggish,  as of somebody who is feeling sick. Speech was decreased in tone and amount, but easy to converse. Affect dysphoric, blunted, again as of somebody who might be feeling sick. Mood stated as being okay. Thoughts are lucid with no evidence of delusional thinking or loosening of associations. Denies auditory or visual hallucinations.  Denies any suicidal or homicidal ideation. Alert and oriented x 4. Good judgment and insight. Normal intelligence.   LABORATORY RESULTS: His CBC is normal. Chemistry panel showed a glucose of 127 on a nonfasting draw. Alkaline phosphatase elevated 158, ALT elevated 234, AST elevated 126. Alcohol undetected. TSH normal. Urinalysis unremarkable. Drug screen positive for opiates and cannabis. Acetaminophen level done this morning was undetectable. Salicylates slightly elevated at 3.1. PTT 31, prothrombin INR 1.0.   ASSESSMENT: A 34 year old man with heroin dependence came in for detox. Complicating factor are the elevated liver enzymes. The fact that he may have consumed a large amount of acetaminophen raises the concern of the acetaminophen toxicity. Because he is giving the history of having done this several days ago, the fact that his Tylenol level undetectable, may not exclude the Tylenol toxicity. Also in the differential diagnosis would be hepatitis A, B or C.  The patient required hospitalization apparently on the medical service for stabilization. The Emergency Room physician talked to poison control, who recommended that he be put on the medical service and stabilized. The patient has been ordered to get acetylcysteine  treatment for Tylenol toxicity, apparently as a presumptive given his history. For the opiate withdrawal, he was initially ordered, morphine IV. This is probably not a very sufficient way to do it. I have recommended that we switched to Suboxone. The patient can be given up to three days of opiate replacement detox in the hospital. If he is ready to leave the hospital prior to that, he will need some assistance in referral to rehab. I am going to put in that care management consult for that.   TREATMENT PLAN: Care management consult will be put into help with finding rehab treatment either at [facility] or at other appropriate facility. At this point, I doubt that he will need more benefit from  transfer to our psychiatric unit, because we are not substance abuse specialized. The patient is requesting referral to a Suboxone provider as well. I have ordered Suboxone 4 mg twice a day for a day and then 4 mg a day for another 2 days after that. He also cam have Imodium as needed,  Zofran as needed, Robaxin as needed. No indication for other psychiatric treatment at this point. It psychiatric assistance is needed over the weekend, please call the on-call physician.   DIAGNOSIS, PRINCIPAL AND PRIMARY:  AXIS I: Opiate dependence.   SECONDARY DIAGNOSES: AXIS I:  Cannabis abuse.   AXIS II: Deferred.   AXIS III: Elevated liver enzymes of unclear etiology.   AXIS IV: Moderate chronic stress from his substance abuse.   AXIS V: Functioning at time of evaluation 38.   ____________________________ Audery AmelJohn T. Dawnetta Copenhaver, MD jtc:cc D: 04/21/2013 19:12:02 ET T: 04/21/2013 20:40:07 ET JOB#: [id number]  cc: Audery AmelJohn T. Lindy Garczynski, MD, <Dictator> Audery AmelJOHN T Taitum Alms MD ELECTRONICALLY SIGNED 04/24/2013 21:09

## 2015-03-22 NOTE — Consult Note (Signed)
Brief Consult Note: Diagnosis: opiate dependence.   Patient was seen by consultant.   Consult note dictated.   Recommend further assessment or treatment.   Orders entered.   Comments: Psychiatry: Patient seen and orders done. PAtient having opiate withdrawl and stabilization of liver enzymes.Ordered suboxone for detox to replace iv morphine. Added some other prn meds. Added HIV test to already done Hepatitis tests. Will put in order for care management to follow up on rehab/[facility] after he is medically stabilized. See Full note.  Electronic Signatures: Audery Amellapacs, John T (MD)  (Signed 23-May-14 18:56)  Authored: Brief Consult Note   Last Updated: 23-May-14 18:56 by Audery Amellapacs, John T (MD)

## 2015-03-22 NOTE — H&P (Signed)
PATIENT NAME:  Ricardo Tran, Ricardo Tran MR#:  [id number] DATE OF BIRTH:  [birth year]  DATE OF ADMISSION:  04/21/2013  REFERRING EMERGENCY ROOM PHYSICIAN: Dr. Dorothea Glassman, MD.   CHIEF COMPLAINT: Elevated liver function tests.   HISTORY OF PRESENT ILLNESS: The patient is a 34 year old male who is a drug addict,  taking heroin IV every day for the last 12 years, now decided to clean himself and go for detox. He wanted to go to rehab for that, but they wanted him to a medical clearance before they can accept him for inpatient rehab and so he was referred to ER. He came to ER, had his initial blood work and it showed elevated LFTs, so rehab for detox. One, came to be medically cleared before he can go for it. Dr. Darnelle Catalan who is ER physician spoke to [facility] as the patient says that he was trying to detox himself and sometimes he takes like 10 to 15 tablets of Percocet in order to relieve his pain, trying not to take heroin. The last time he took like that was four days ago on Monday; but he has been on unsuccessful helping him to get rid of heroin, so when Dr.  Darnelle Catalan spoke to [facility], his LFTs are only in the range of 200.  He does not need to have antidote therapy for acetaminophen toxicity and his acetaminophen level is also normal. He needs to be under observation and follow one more level on medical floor and then decide about medical clearance to go for detox. So hospitalist service is called in for this issue.   REVIEW OF SYSTEMS: Negative for fever, fatigue, weakness.  He states he started feeling pain all over and his last heroin he took was last night. No weight loss.   EYES: No blurring or double vision, pain or redness.   EARS, NOSE, THROAT: No tinnitus, ear pain, hearing loss or discharge.   RESPIRATORY: No cough, wheezing, hemoptysis or dyspnea.   CARDIOVASCULAR: No chest pain, orthopnea, edema or arrhythmia.   GASTROINTESTINAL: Feeling nauseous, but no vomiting,  pain  GENITOURINARY: No dysuria, hematuria or increased frequency of the urination.   ENDOCRINE: no nocturia or heat or cold intolerance.   MUSCULOSKELETAL: No pain or swelling in the joints. He has pain in the back.   NEUROLOGICAL: No numbness, weakness or tremors.   PAST MEDICAL HISTORY: None.   PAST SURGICAL HISTORY: Kidney stone and has undergone lithotripsy for that.   HOME MEDICATIONS: He takes sometimes Percocet or Vicodin to get of pain and he tries not to take heroin.   SOCIAL HISTORY: He injects heroin daily sometimes twice daily for the last 12 years and he also smoked, marijuana. He smoked 2 packs cigarettes per day. Denies alcohol use.   FAMILY HISTORY: Positive for liver disease as he says his father told him like that; he does not know what disease of the liver.   PHYSICAL EXAMINATION: VITAL SIGNS: In ER, temperature 98.4, pulse 53, respirations 18, blood pressure 118/66 and pulse oximetry 98 on room air.   GENERAL: He is fully alert, oriented to time, place, and person. No acute distress.   HEENT: Head and neck atraumatic. Conjunctivae pink. Oral mucosa moist.   NECK: Supple. No JVD.   RESPIRATORY: Bilateral clear and equal air entry.   CARDIOVASCULAR: S1, S2 present, regular. No murmurs appreciated.   ABDOMEN: Soft, nontender. Bowel sounds present. No organomegaly.   SKIN: No rashes. Joints: No edema.   NEUROLOGICAL:  Power 5/, moves all 4 limbs. No tremors.   PSYCHIATRIC: Does not appear in any acute psychiatric illness.   LABORATORY RESULTS: Glucose 127, BUN 10, creatinine 0.84, sodium 137, potassium 3.8, chloride 104, CO2 29. Ethanol level less than 0.03. Total protein 7.8, albumin 3.9, bilirubin 0.4. Next, alkaline phosphatase 158, SGOT 126, SGPT 234. TSH level 2.58. Urine for toxicology positive for cocaine and opiate. INR is 1. Urinalysis is negative. Acetaminophen level less than 2  and salicylate level is 8.1.   ASSESSMENT AND PLAN: A 34 year old  male who is a heroin addict and being admitted to medical service for medical clearance due to elevated liver function tests before going for heroin detox rehab program.  1. Elevated liver function tests. The patient is taking 10 to 15 Percocet on impulsive basis every few days when he tries not to take heroin. Last time he took 4 days ago. Currently, acetaminophen level is less than 2 and his AST, ALT are in the range of 200.  [facility]inda [facility] and they suggested to observation him and follow his liver function test one more time after a few hours and see how he does. So we will admit him to medical floor for observation and follow complete metabolic panel tomorrow morning, follow liver function tests and see.  2. Heroin addict started having withdrawals.  He said that he started feeling pain all over and he is also feeling nauseous. Will give symptomatic treatment with morphine IV and Zofran for now and once his liver function tests do not rise further and start dropping, he will be transferred to inpatient rehab for heroin. 3. Smoker. Tobacco abuse. He smoked 2 packs cigarettes per day. Smoking cessation counseling is done and he agrees to try nicotine inhaler while he is in the hospital. Total counseling done for 5 minutes.   CODE STATUS: Full code.   TOTAL TIME SPENT ON THIS ADMISSION: 45 minutes    ____________________________ Hope PigeonVaibhavkumar G. Elisabeth PigeonVachhani, MD vgv:rw D: 04/21/2013 12:36:00 ET T: 04/21/2013 13:06:22 ET JOB#: [id number]  cc: Hope PigeonVaibhavkumar G. Elisabeth PigeonVachhani, MD, <Dictator> Altamese DillingVAIBHAVKUMAR Raivyn Kabler MD ELECTRONICALLY SIGNED 04/27/2013 14:23

## 2015-09-09 IMAGING — US US RENAL KIDNEY
1 series · 14 of 23 positions shown · non-contrast
Comparison: CT abdomen pelvis dated 01/25/2012

CLINICAL DATA: Right flank pain, hematuria, history of stones

EXAM:
RENAL/URINARY TRACT ULTRASOUND COMPLETE

[Series 1: us renal kidney · 0.21mm/px · 14 of 23 slices shown]
[im 1/23]
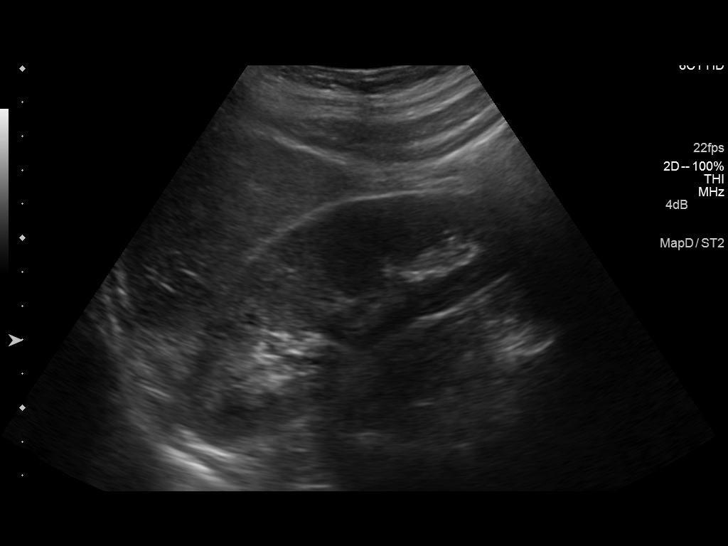
[im 3/23]
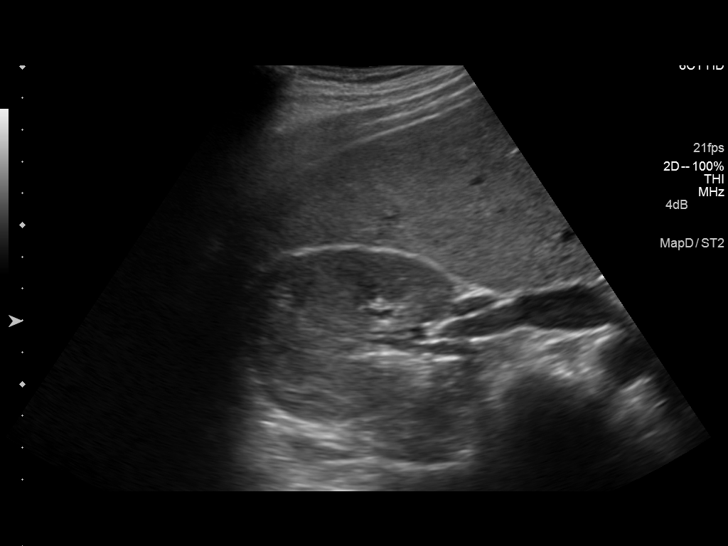
[im 5/23]
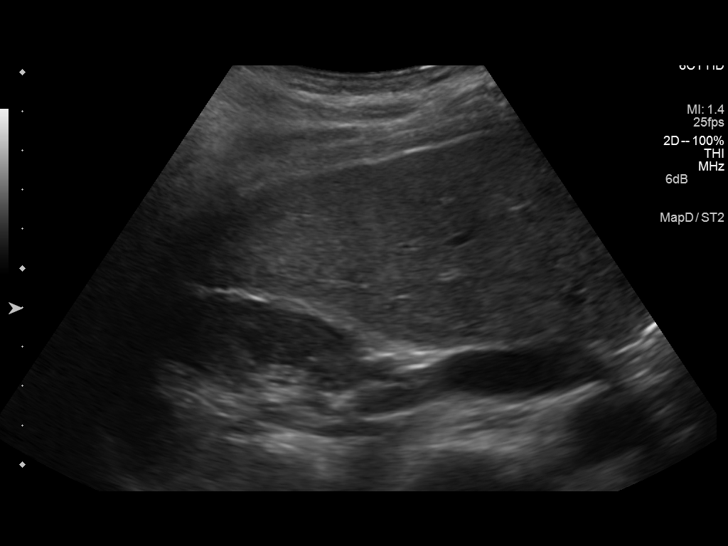
[im 6/23]
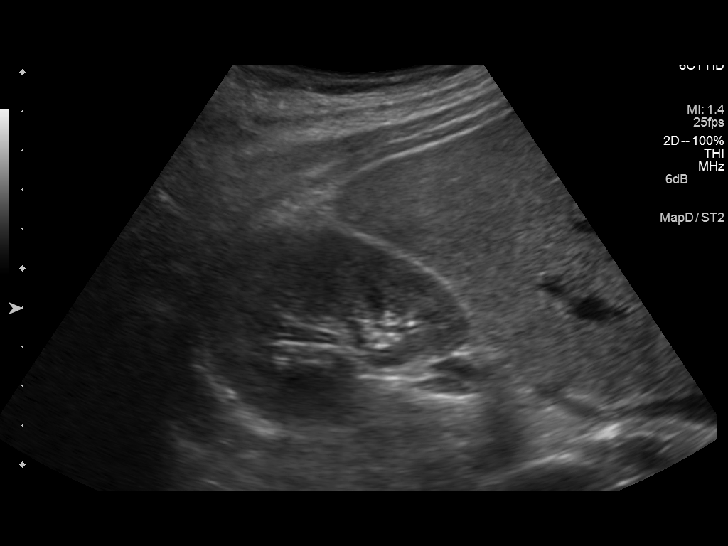
[im 8/23]
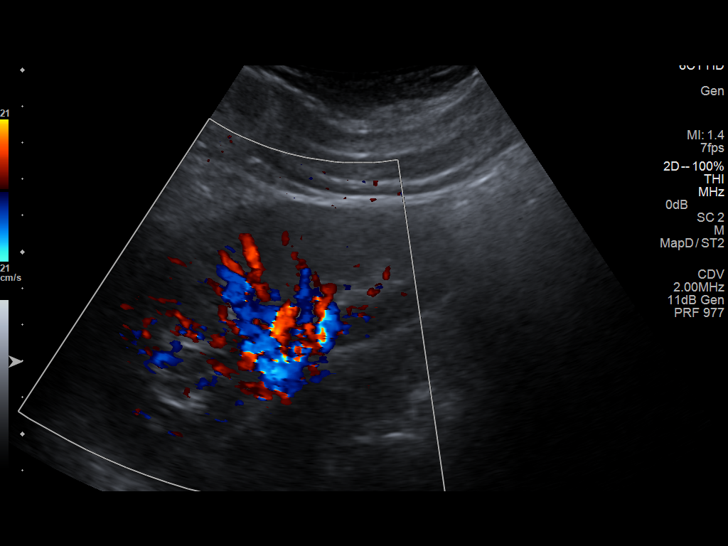
[im 10/23]
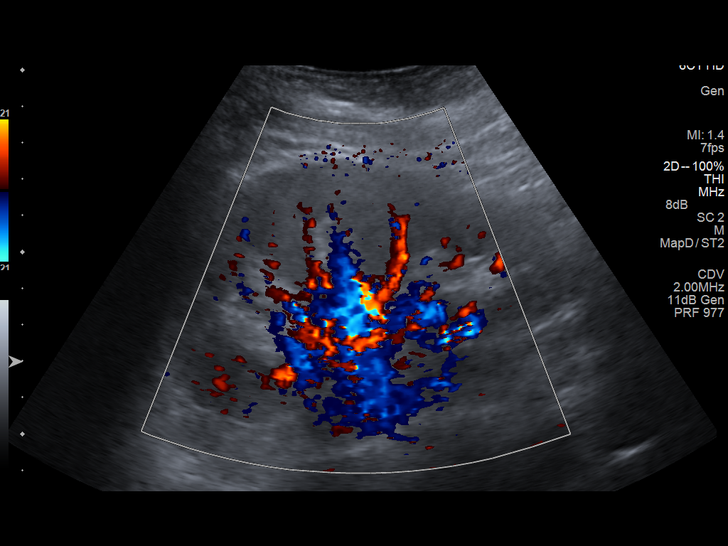
[im 11/23]
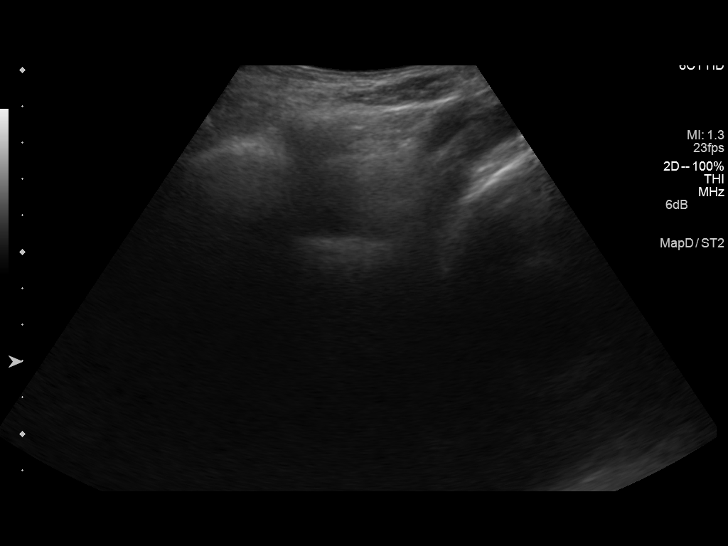
[im 13/23]
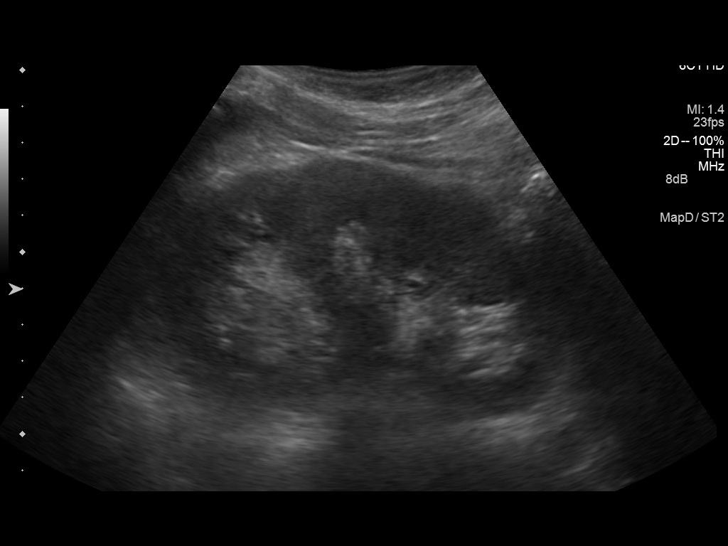
[im 14/23]
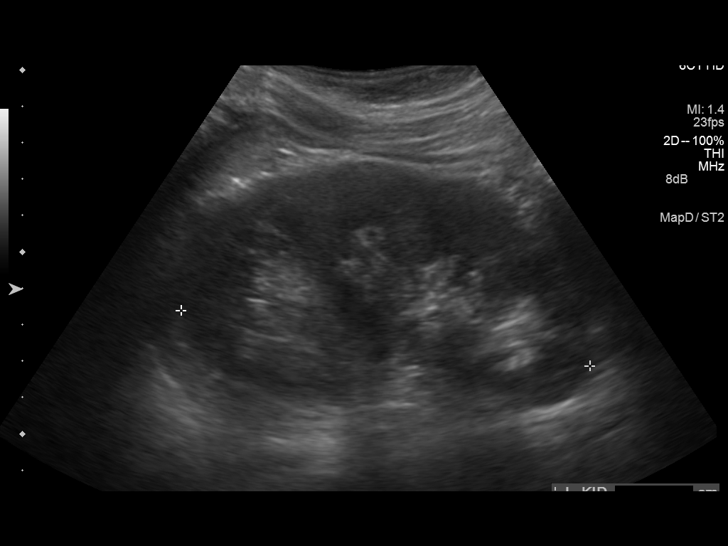
[im 16/23]
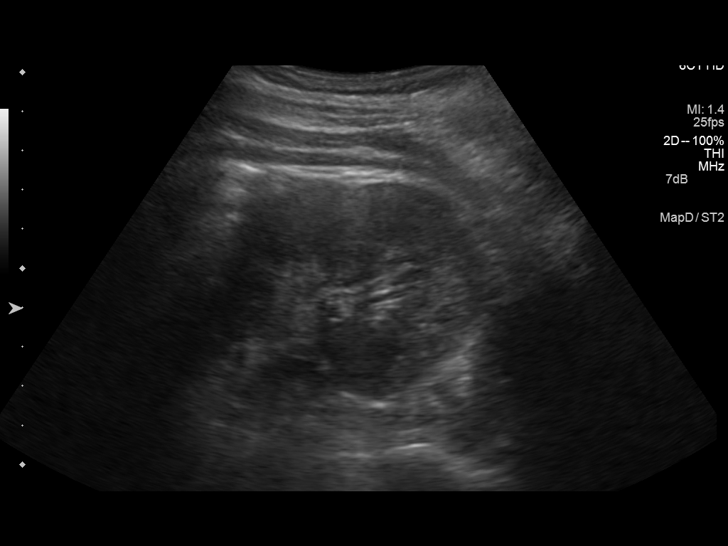
[im 18/23]
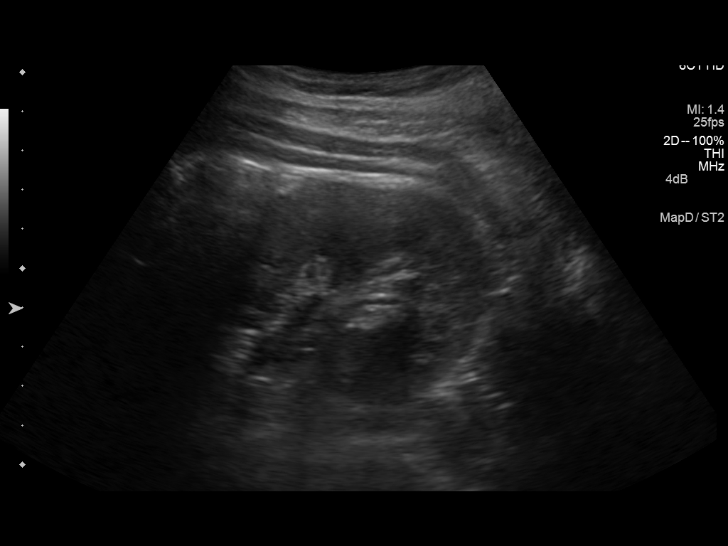
[im 19/23]
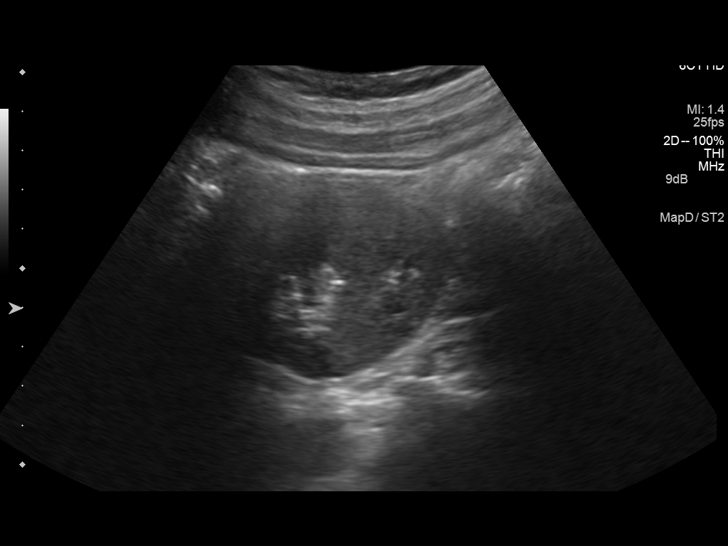
[im 21/23]
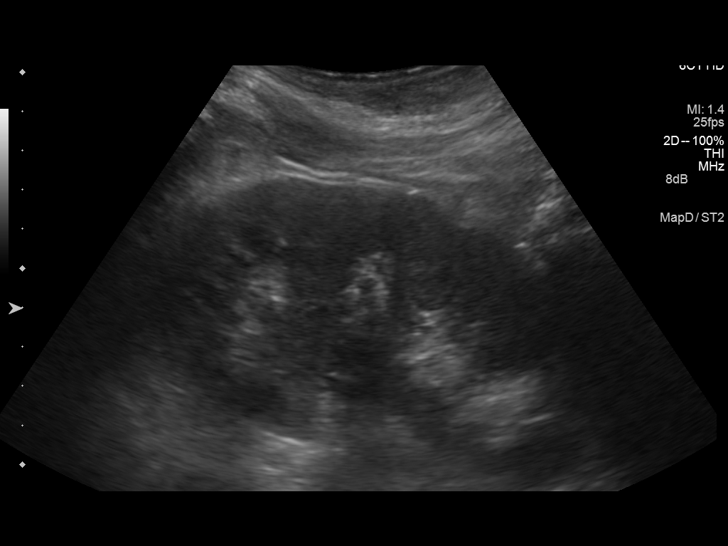
[im 23/23]
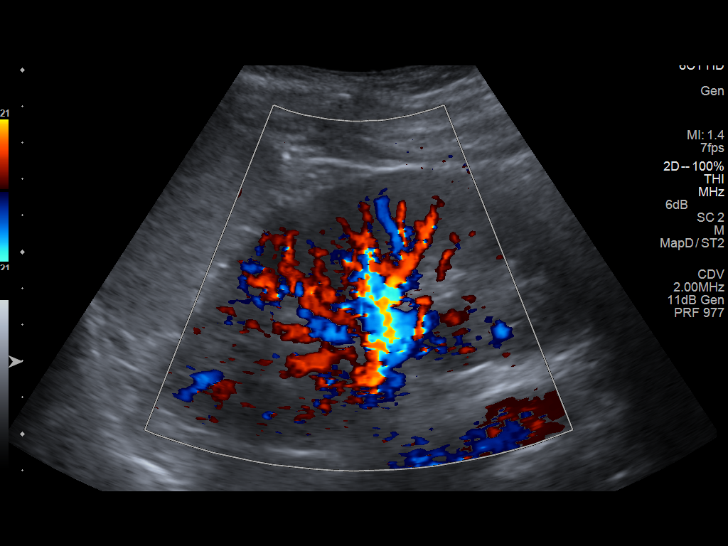

[14 of 23 positions shown; findings below may reference images not displayed]

FINDINGS: Right Kidney:

Length: 11.1 cm.  No mass or hydronephrosis.

Left Kidney:

Length: 11.3 cm.  No mass or hydronephrosis.

Bladder:

Poorly visualized/underdistended.
IMPRESSION: No hydronephrosis.

Bladder is underdistended.

## 2016-03-13 ENCOUNTER — Emergency Department: Payer: Self-pay

## 2016-03-13 ENCOUNTER — Emergency Department
Admission: EM | Admit: 2016-03-13 | Discharge: 2016-03-13 | Disposition: A | Discharge status: Home / Self Care | Payer: Self-pay | Attending: Emergency Medicine | Admitting: Emergency Medicine

## 2016-03-13 ENCOUNTER — Encounter: Payer: Self-pay | Admitting: Emergency Medicine

## 2016-03-13 DIAGNOSIS — R079 Chest pain, unspecified: Secondary | ICD-10-CM

## 2016-03-13 DIAGNOSIS — F17210 Nicotine dependence, cigarettes, uncomplicated: Secondary | ICD-10-CM | POA: Insufficient documentation

## 2016-03-13 DIAGNOSIS — B360 Pityriasis versicolor: Secondary | ICD-10-CM | POA: Insufficient documentation

## 2016-03-13 DIAGNOSIS — R0789 Other chest pain: Secondary | ICD-10-CM | POA: Insufficient documentation

## 2016-03-13 DIAGNOSIS — F1190 Opioid use, unspecified, uncomplicated: Secondary | ICD-10-CM | POA: Insufficient documentation

## 2016-03-13 LAB — BASIC METABOLIC PANEL
ANION GAP: 7 (ref 5–15)
BUN: 13 mg/dL (ref 6–20)
CHLORIDE: 102 mmol/L (ref 101–111)
CO2: 28 mmol/L (ref 22–32)
Calcium: 10 mg/dL (ref 8.9–10.3)
Creatinine, Ser: 0.87 mg/dL (ref 0.61–1.24)
GFR calc Af Amer: >60 mL/min (ref >60)
GFR calc non Af Amer: >60 mL/min (ref >60)
GLUCOSE: 96 mg/dL (ref 65–99)
POTASSIUM: 3.7 mmol/L (ref 3.5–5.1)
Sodium: 137 mmol/L (ref 135–145)

## 2016-03-13 LAB — CBC
HEMATOCRIT: 44.3 % (ref 40.0–52.0)
HEMOGLOBIN: 15 g/dL (ref 13.0–18.0)
MCH: 30.1 pg (ref 26.0–34.0)
MCHC: 33.8 g/dL (ref 32.0–36.0)
MCV: 89.1 fL (ref 80.0–100.0)
Platelets: 306 10*3/uL (ref 150–440)
RBC: 4.97 MIL/uL (ref 4.40–5.90)
RDW: 14 % (ref 11.5–14.5)
WBC: 8.4 10*3/uL (ref 3.8–10.6)

## 2016-03-13 LAB — TROPONIN I: Troponin I: <0.03 ng/mL (ref <0.031)

## 2016-03-13 MED ORDER — KETOCONAZOLE 2 % EX CREA: 1.0000 "application " | TOPICAL_CREAM | Freq: Every day | CUTANEOUS | Status: AC

## 2016-03-13 NOTE — Discharge Instructions (Signed)
Nonspecific Chest Pain  Chest pain can be caused by many different conditions. There is always a chance that your pain could be related to something serious, such as a heart attack or a blood clot in your lungs. Chest pain can also be caused by conditions that are not life-threatening. If you have chest pain, it is very important to follow up with your health care provider. CAUSES  Chest pain can be caused by:  Heartburn.  Pneumonia or bronchitis.  Anxiety or stress.  Inflammation around your heart (pericarditis) or lung (pleuritis or pleurisy).  A blood clot in your lung.  A collapsed lung (pneumothorax). It can develop suddenly on its own (spontaneous pneumothorax) or from trauma to the chest.  Shingles infection (varicella-zoster virus).  Heart attack.  Damage to the bones, muscles, and cartilage that make up your chest wall. This can include:  Bruised bones due to injury.  Strained muscles or cartilage due to frequent or repeated coughing or overwork.  Fracture to one or more ribs.  Sore cartilage due to inflammation (costochondritis). RISK FACTORS  Risk factors for chest pain may include:  Activities that increase your risk for trauma or injury to your chest.  Respiratory infections or conditions that cause frequent coughing.  Medical conditions or overeating that can cause heartburn.  Heart disease or family history of heart disease.  Conditions or health behaviors that increase your risk of developing a blood clot.  Having had chicken pox (varicella zoster). SIGNS AND SYMPTOMS Chest pain can feel like:  Burning or tingling on the surface of your chest or deep in your chest.  Crushing, pressure, aching, or squeezing pain.  Dull or sharp pain that is worse when you move, cough, or take a deep breath.  Pain that is also felt in your back, neck, shoulder, or arm, or pain that spreads to any of these areas. Your chest pain may come and go, or it may stay  constant. DIAGNOSIS Lab tests or other studies may be needed to find the cause of your pain. Your health care provider may have you take a test called an ambulatory ECG (electrocardiogram). An ECG records your heartbeat patterns at the time the test is performed. You may also have other tests, such as:  Transthoracic echocardiogram (TTE). During echocardiography, sound waves are used to create a picture of all of the heart structures and to look at how blood flows through your heart.  Transesophageal echocardiogram (TEE).This is a more advanced imaging test that obtains images from inside your body. It allows your health care provider to see your heart in finer detail.  Cardiac monitoring. This allows your health care provider to monitor your heart rate and rhythm in real time.  Holter monitor. This is a portable device that records your heartbeat and can help to diagnose abnormal heartbeats. It allows your health care provider to track your heart activity for several days, if needed.  Stress tests. These can be done through exercise or by taking medicine that makes your heart beat more quickly.  Blood tests.  Imaging tests. TREATMENT  Your treatment depends on what is causing your chest pain. Treatment may include:  Medicines. These may include:  Acid blockers for heartburn.  Anti-inflammatory medicine.  Pain medicine for inflammatory conditions.  Antibiotic medicine, if an infection is present.  Medicines to dissolve blood clots.  Medicines to treat coronary artery disease.  Supportive care for conditions that do not require medicines. This may include:  Resting.  Applying heat  or cold packs to injured areas.  Limiting activities until pain decreases. HOME CARE INSTRUCTIONS  If you were prescribed an antibiotic medicine, finish it all even if you start to feel better.  Avoid any activities that bring on chest pain.  Do not use any tobacco products, including  cigarettes, chewing tobacco, or electronic cigarettes. If you need help quitting, ask your health care provider.  Do not drink alcohol.  Take medicines only as directed by your health care provider.  Keep all follow-up visits as directed by your health care provider. This is important. This includes any further testing if your chest pain does not go away.  If heartburn is the cause for your chest pain, you may be told to keep your head raised (elevated) while sleeping. This reduces the chance that acid will go from your stomach into your esophagus.  Make lifestyle changes as directed by your health care provider. These may include:  Getting regular exercise. Ask your health care provider to suggest some activities that are safe for you.  Eating a heart-healthy diet. A registered dietitian can help you to learn healthy eating options.  Maintaining a healthy weight.  Managing diabetes, if necessary.  Reducing stress. SEEK MEDICAL CARE IF:  Your chest pain does not go away after treatment.  You have a rash with blisters on your chest.  You have a fever. SEEK IMMEDIATE MEDICAL CARE IF:   Your chest pain is worse.  You have an increasing cough, or you cough up blood.  You have severe abdominal pain.  You have severe weakness.  You faint.  You have chills.  You have sudden, unexplained chest discomfort.  You have sudden, unexplained discomfort in your arms, back, neck, or jaw.  You have shortness of breath at any time.  You suddenly start to sweat, or your skin gets clammy.  You feel nauseous or you vomit.  You suddenly feel light-headed or dizzy.  Your heart begins to beat quickly, or it feels like it is skipping beats. These symptoms may represent a serious problem that is an emergency. Do not wait to see if the symptoms will go away. Get medical help right away. Call your local emergency services (911 in the [place] Do not drive yourself to the hospital.   This  information is not intended to replace advice given to you by your health care provider. Make sure you discuss any questions you have with your health care provider.   Document Released: 08/26/2005 Document Revised: 12/07/2014 Document Reviewed: 06/22/2014 Elsevier Interactive Patient Education 2016 [employer]  Tinea Versicolor Tinea versicolor is a common fungal infection of the skin. It causes a rash that appears as light or dark patches on the skin. The rash most often occurs on the chest, back, neck, or upper arms. This condition is more common during warm weather. Other than affecting how your skin looks, tinea versicolor usually does not cause other problems. In most cases, the infection goes away in a few weeks with treatment. It may take a few months for the patches on your skin to clear up. CAUSES Tinea versicolor occurs when a type of fungus that is normally present on the skin starts to overgrow. This fungus is a kind of yeast. The exact cause of the overgrowth is not known. This condition cannot be passed from one person to another (noncontagious). RISK FACTORS This condition is more likely to develop when certain factors are present, such as:  Heat and humidity.  Sweating too  much.  Hormone changes.  Oily skin.  A weak defense (immune) system. SYMPTOMS Symptoms of this condition may include:  A rash on your skin that is made up of light or dark patches. The rash may have:  Patches of tan or pink spots on light skin.  Patches of white or brown spots on dark skin.  Patches of skin that do not tan.  Well-marked edges.  Scales on the discolored areas.  Mild itching. DIAGNOSIS A health care provider can usually diagnose this condition by looking at your skin. During the exam, he or she may use ultraviolet light to help determine the extent of the infection. In some cases, a skin sample may be taken by scraping the rash. This sample will be viewed under a microscope  to check for yeast overgrowth. TREATMENT Treatment for this condition may include:  Dandruff shampoo that is applied to the affected skin during showers or bathing.  Over-the-counter medicated skin cream, lotion, or soaps.  Prescription antifungal medicine in the form of skin cream or pills.  Medicine to help reduce itching. HOME CARE INSTRUCTIONS  Take medicines only as directed by your health care provider.  Apply dandruff shampoo to the affected area if told to do so by your health care provider. You may be instructed to scrub the affected skin for several minutes each day.  Do not scratch the affected area of skin.  Avoid hot and humid conditions.  Do not use tanning booths.  Try to avoid sweating a lot. SEEK MEDICAL CARE IF:  Your symptoms get worse.  You have a fever.  You have redness, swelling, or pain at the site of your rash.  You have fluid, blood, or pus coming from your rash.  Your rash returns after treatment.   This information is not intended to replace advice given to you by your health care provider. Make sure you discuss any questions you have with your health care provider.   Document Released: 11/13/2000 Document Revised: 12/07/2014 Document Reviewed: 08/28/2014 Elsevier Interactive Patient Education [employer]

## 2016-03-13 NOTE — ED Notes (Addendum)
Pt reports left sided chest pain that started 3-4 days ago with syncopal episodes and dizziness; pt with rash to torso and bilateral arms. 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 Denies cocaine use.

## 2016-03-13 NOTE — ED Provider Notes (Signed)
[facility] Emergency Department Provider Note  ____________________________________________    I have reviewed the triage vital signs and the nursing notes.   HISTORY  Chief Complaint Chest Pain    HPI Ricardo Tran is a 35 y.o. male who presents with numerous complaints. He reports of intermittent chest discomfort over the last 3 days. He describes it as a sharp pain that is very brief. He has not had any today. He has also had dizziness separate from his chest discomfort. He denies drug use. No fevers or chills. No head injury. In addition he complains of a rash that he has had on his back and his arm for several months. No Shortness of breath or pleurisy     History reviewed. No pertinent past medical history.  There are no active problems to display for this patient.   Past Surgical History  Procedure Laterality Date  . Kidney stone surgery      Current Outpatient Rx  Name  Route  Sig  Dispense  Refill  . ketoconazole (NIZORAL) 2 % cream   Topical   Apply 1 application topically daily.   15 g   0     Allergies Acetaminophen  No family history on file.  Social History Social History  Substance Use Topics  . Smoking status: Current Every Day Smoker    Types: Cigarettes  . Smokeless tobacco: None  . Alcohol Use: No    Review of Systems  Constitutional: Negative for fever. Eyes: Negative for redness ENT: Negative for sore throat Cardiovascular: As above Respiratory: Negative for shortness of breath. Gastrointestinal: Negative for abdominal pain Genitourinary: Negative for dysuria. Musculoskeletal: Negative for back pain. Skin: Positive for rash Neurological: Negative for focal weakness Psychiatric: Positive for anxiety    ____________________________________________   PHYSICAL EXAM:  VITAL SIGNS: ED Triage Vitals  Enc Vitals Group     BP 03/13/16 1337 113/81 mmHg     Pulse Rate 03/13/16 1337 90     Resp  03/13/16 1337 16     Temp 03/13/16 1337 97.9 F (36.6 C)     Temp Source 03/13/16 1337 Oral     SpO2 03/13/16 1337 100 %     Weight 03/13/16 1337 159 lb (72.122 kg)     Height 03/13/16 1337 6\' 2"  (1.88 m)     Head Cir --      Peak Flow --      Pain Score 03/13/16 1337 0     Pain Loc --      Pain Edu? --      Excl. in GC? --      Constitutional: Alert and oriented. Well appearing and in no distress.  Eyes: Conjunctivae are normal. No erythema or injection ENT   Head: Normocephalic and atraumatic.   Mouth/Throat: Mucous membranes are moist. Cardiovascular: Normal rate, regular rhythm. Normal and symmetric distal pulses are present in the upper extremities. No murmurs or rubs  Respiratory: Normal respiratory effort without tachypnea nor retractions. Breath sounds are clear and equal bilaterally.  Gastrointestinal: Soft and non-tender in all quadrants. No distention. There is no CVA tenderness. Genitourinary: deferred Musculoskeletal: Nontender with normal range of motion in all extremities. No lower extremity tenderness nor edema. Neurologic:  Normal speech and language. No gross focal neurologic deficits are appreciated. Skin:  Skin is warm, dry and intact. Patient with rash on right and left arm and back, with mild hypopigmentation, consistent with tinea versicolor Psychiatric: Mood and affect are normal. Patient exhibits  appropriate insight and judgment.  ____________________________________________    LABS (pertinent positives/negatives)  Labs Reviewed  BASIC METABOLIC PANEL  CBC  TROPONIN I    ____________________________________________   EKG  ED ECG REPORT I, Jene Every, the attending physician, personally viewed and interpreted this ECG.  Date: 03/13/2016 EKG Time: 1:36 PM Rate: 91 Rhythm: normal sinus rhythm QRS Axis: normal Intervals: normal ST/T Wave abnormalities: normal Conduction Disturbances: none Narrative Interpretation:  unremarkable   ____________________________________________    RADIOLOGY  Chest x-ray unremarkable  ____________________________________________   PROCEDURES  Procedure(s) performed: none  Critical Care performed: none  ____________________________________________   INITIAL IMPRESSION / ASSESSMENT AND PLAN / ED COURSE  Pertinent labs & imaging results that were available during my care of the patient were reviewed by me and considered in my medical decision making (see chart for details).  Patient well-appearing and in no distress. EKG is normal. Lab work is unremarkable. Chest x-ray benign. History of present illness not consistent with ACS. No history of drug use. No chest pain today. Rash is consistent with anemia versicolor, we will treat this with topical antifungals. Patient is relieved by this. He knows to return to the emergency department if any return of his chest pain. Follow-up with PCP. Return precautions discussed.  ____________________________________________   FINAL CLINICAL IMPRESSION(S) / ED DIAGNOSES  Final diagnoses:  Chest pain, unspecified chest pain type  Tinea versicolor          Jene Every, MD 03/13/16 1616

## 2017-03-25 IMAGING — CR DG CHEST 2V
1 series · 2 of 2 positions shown · non-contrast
Comparison: Chest radiograph 07/23/2013

CLINICAL DATA: Patient with chest pain, dizziness and weakness.

EXAM:
CHEST  2 VIEW

[Series 1: dg chest 2 view · 0.14mm/px · 2 of 2 slices shown]
[im 1/2]
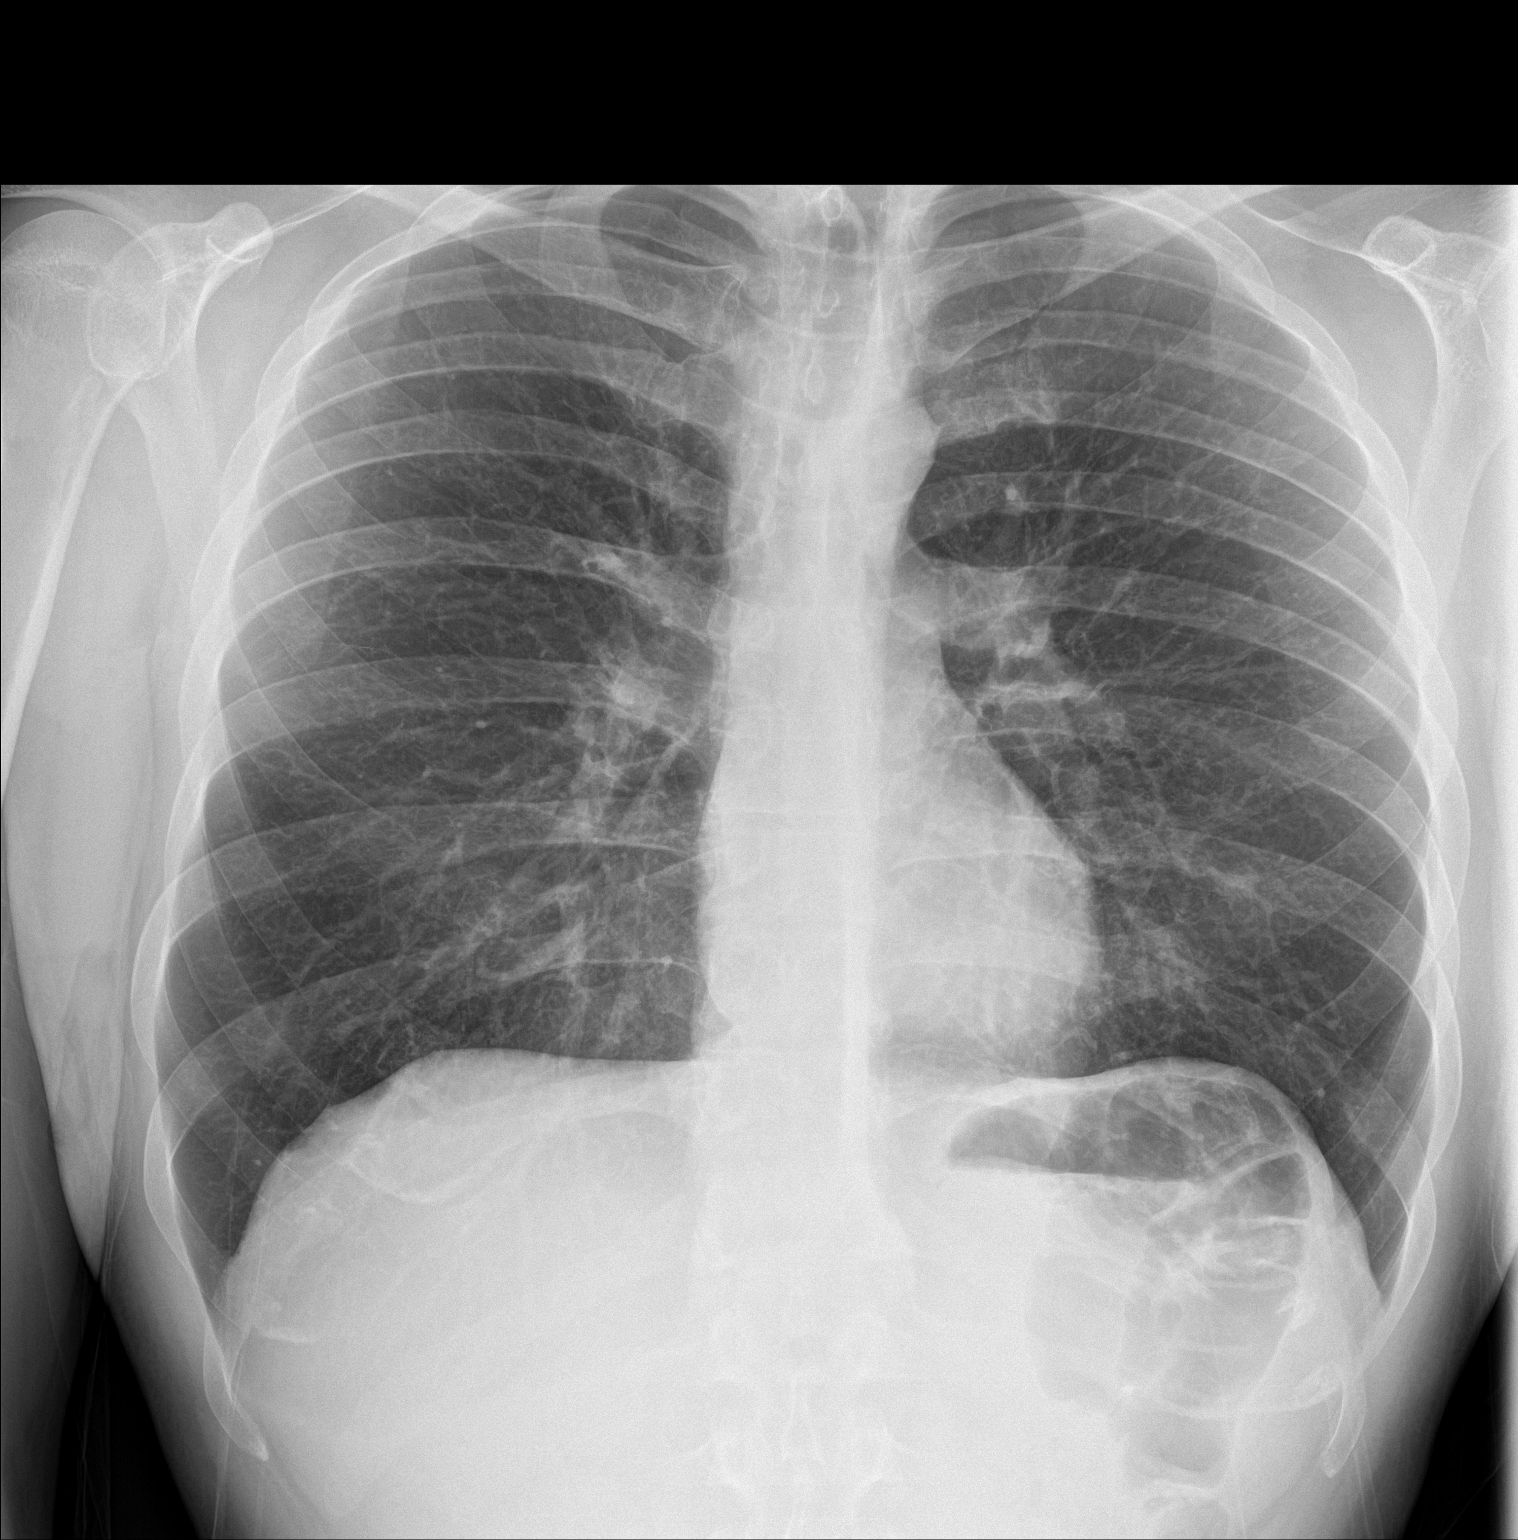
[im 2/2]
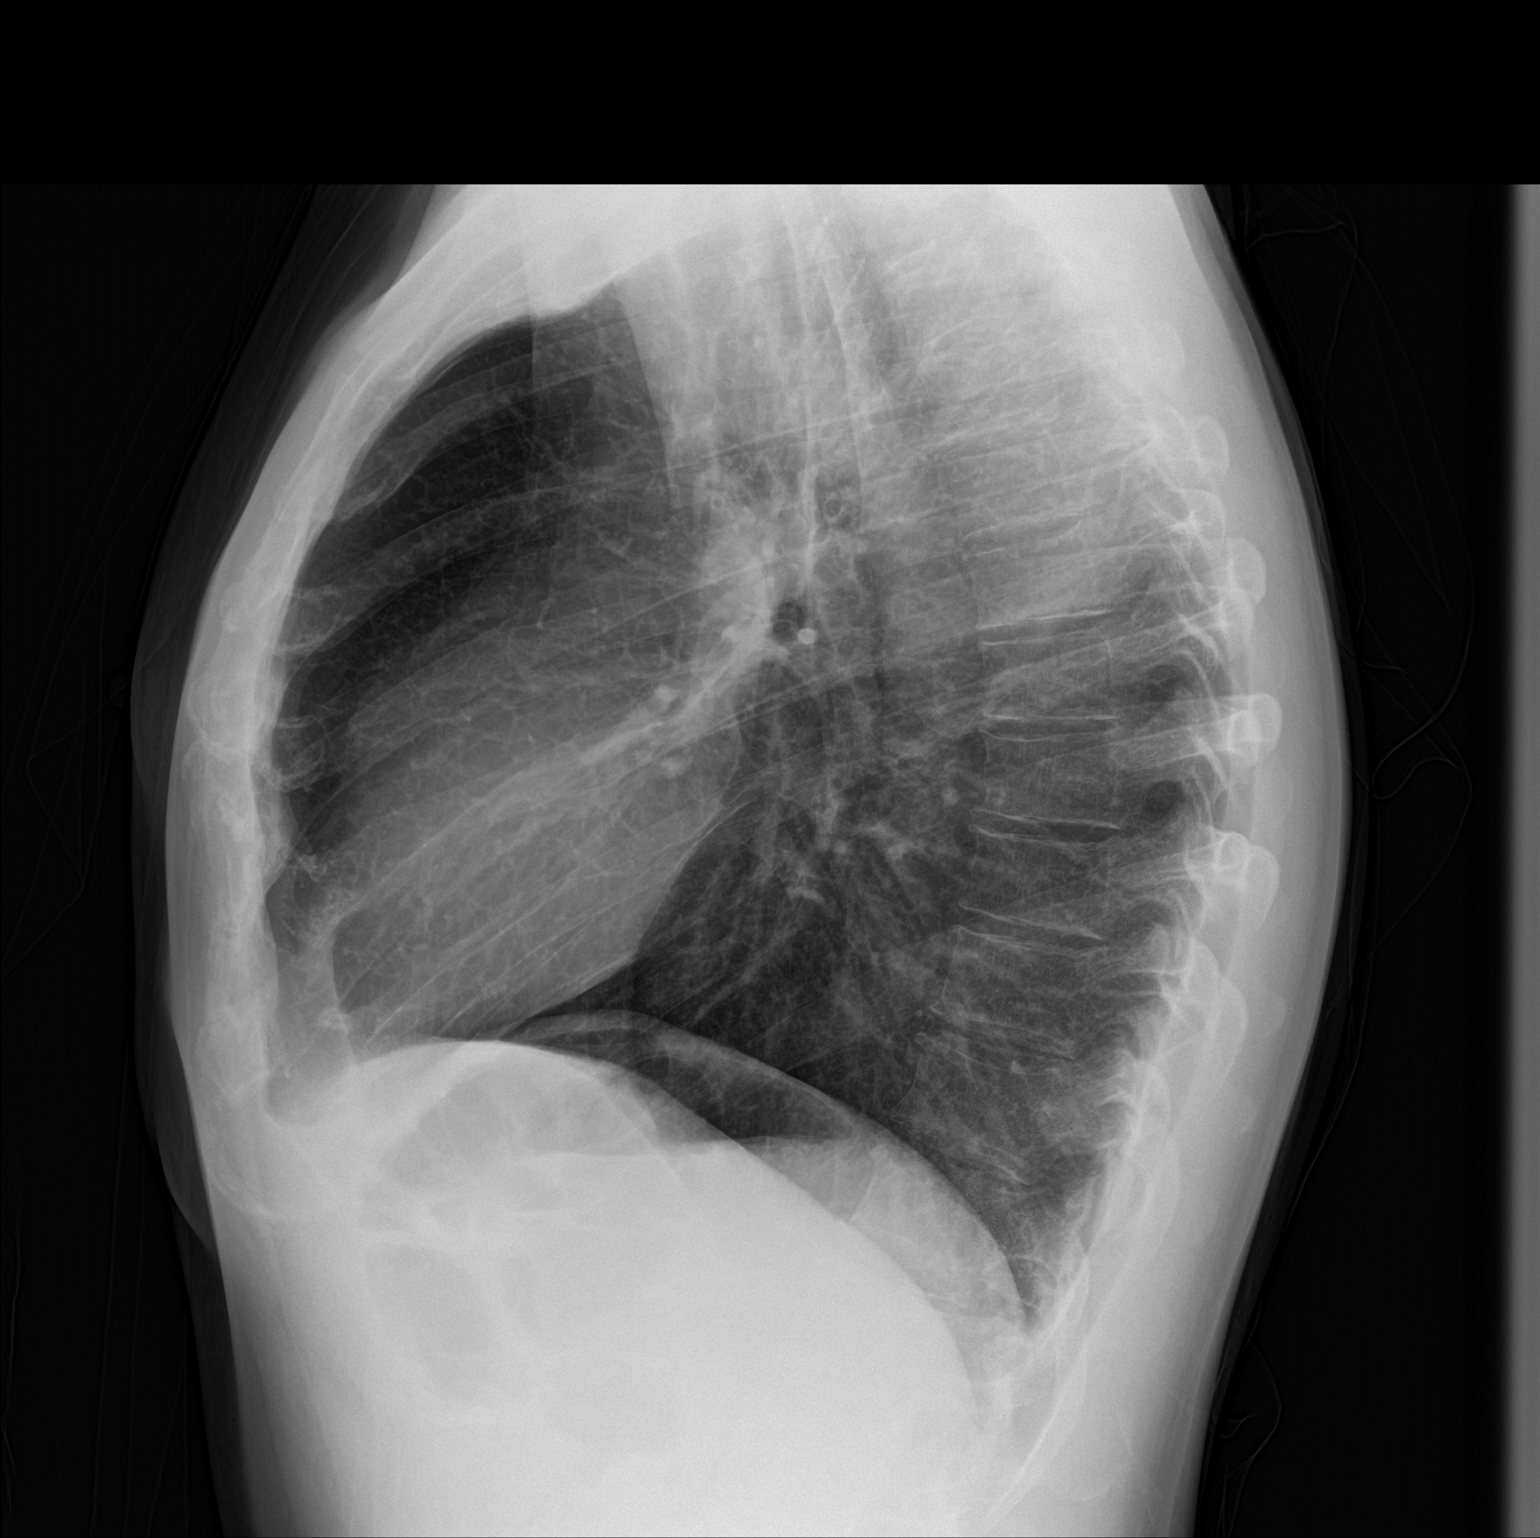

[2 of 2 positions shown; findings below may reference images not displayed]

FINDINGS: Stable cardiac mediastinal contours. No consolidative pulmonary
opacities. No pleural effusion or pneumothorax. Regional skeleton is
unremarkable.
IMPRESSION: No active cardiopulmonary disease.
